# Patient Record
Sex: Female | Born: 2000 | Race: Black or African American | Hispanic: No | Marital: Single | Smoking: Former smoker
Health system: Southern US, Community
[De-identification: ages and names within clinical notes are randomized; demographics above are authoritative.]

## PROBLEM LIST (undated history)

## (undated) ENCOUNTER — Emergency Department (HOSPITAL_COMMUNITY): Admission: EM | Payer: No Typology Code available for payment source | Source: Home / Self Care

## (undated) DIAGNOSIS — G932 Benign intracranial hypertension: Secondary | ICD-10-CM

## (undated) DIAGNOSIS — R519 Headache, unspecified: Secondary | ICD-10-CM

## (undated) DIAGNOSIS — G919 Hydrocephalus, unspecified: Secondary | ICD-10-CM

## (undated) DIAGNOSIS — F419 Anxiety disorder, unspecified: Secondary | ICD-10-CM

## (undated) DIAGNOSIS — D497 Neoplasm of unspecified behavior of endocrine glands and other parts of nervous system: Secondary | ICD-10-CM

## (undated) DIAGNOSIS — R42 Dizziness and giddiness: Secondary | ICD-10-CM

## (undated) DIAGNOSIS — N809 Endometriosis, unspecified: Secondary | ICD-10-CM

## (undated) HISTORY — DX: Dizziness and giddiness: R42

## (undated) HISTORY — DX: Headache, unspecified: R51.9

---
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
## 2019-01-31 ENCOUNTER — Encounter (HOSPITAL_COMMUNITY): Payer: Self-pay | Admitting: Emergency Medicine

## 2019-01-31 ENCOUNTER — Emergency Department (HOSPITAL_COMMUNITY)

## 2019-01-31 ENCOUNTER — Inpatient Hospital Stay (HOSPITAL_COMMUNITY)
Admission: EM | Admit: 2019-01-31 | Discharge: 2019-02-01 | DRG: 641 | Discharge status: Against Medical Advice | Attending: Internal Medicine | Admitting: Internal Medicine

## 2019-01-31 ENCOUNTER — Other Ambulatory Visit: Payer: Self-pay

## 2019-01-31 DIAGNOSIS — A419 Sepsis, unspecified organism: Secondary | ICD-10-CM

## 2019-01-31 DIAGNOSIS — D696 Thrombocytopenia, unspecified: Secondary | ICD-10-CM | POA: Diagnosis present

## 2019-01-31 DIAGNOSIS — Z20828 Contact with and (suspected) exposure to other viral communicable diseases: Secondary | ICD-10-CM | POA: Diagnosis present

## 2019-01-31 DIAGNOSIS — T7840XA Allergy, unspecified, initial encounter: Secondary | ICD-10-CM | POA: Diagnosis present

## 2019-01-31 DIAGNOSIS — R6510 Systemic inflammatory response syndrome (SIRS) of non-infectious origin without acute organ dysfunction: Secondary | ICD-10-CM | POA: Diagnosis not present

## 2019-01-31 DIAGNOSIS — T368X5A Adverse effect of other systemic antibiotics, initial encounter: Secondary | ICD-10-CM | POA: Diagnosis present

## 2019-01-31 DIAGNOSIS — T360X5A Adverse effect of penicillins, initial encounter: Secondary | ICD-10-CM | POA: Diagnosis not present

## 2019-01-31 DIAGNOSIS — E860 Dehydration: Principal | ICD-10-CM | POA: Diagnosis present

## 2019-01-31 DIAGNOSIS — L509 Urticaria, unspecified: Secondary | ICD-10-CM | POA: Diagnosis present

## 2019-01-31 DIAGNOSIS — Z982 Presence of cerebrospinal fluid drainage device: Secondary | ICD-10-CM

## 2019-01-31 DIAGNOSIS — L02212 Cutaneous abscess of back [any part, except buttock]: Secondary | ICD-10-CM | POA: Diagnosis present

## 2019-01-31 DIAGNOSIS — E872 Acidosis: Secondary | ICD-10-CM | POA: Diagnosis present

## 2019-01-31 LAB — COMPREHENSIVE METABOLIC PANEL
ALT: 9 U/L (ref 0–44)
AST: 15 U/L (ref 15–41)
Albumin: 3.8 g/dL (ref 3.5–5.0)
Alkaline Phosphatase: 43 U/L (ref 38–126)
Anion gap: 16 — ABNORMAL HIGH (ref 5–15)
BUN: 11 mg/dL (ref 6–20)
CO2: 16 mmol/L — ABNORMAL LOW (ref 22–32)
Calcium: 9.1 mg/dL (ref 8.9–10.3)
Chloride: 102 mmol/L (ref 98–111)
Creatinine, Ser: 1.09 mg/dL — ABNORMAL HIGH (ref 0.44–1.00)
GFR calc Af Amer: >60 mL/min (ref >60)
GFR calc non Af Amer: >60 mL/min (ref >60)
Glucose, Bld: 130 mg/dL — ABNORMAL HIGH (ref 70–99)
Potassium: 3.6 mmol/L (ref 3.5–5.1)
Sodium: 134 mmol/L — ABNORMAL LOW (ref 135–145)
Total Bilirubin: 0.9 mg/dL (ref 0.3–1.2)
Total Protein: 7.1 g/dL (ref 6.5–8.1)

## 2019-01-31 LAB — ACETAMINOPHEN LEVEL: Acetaminophen (Tylenol), Serum: <10 ug/mL — ABNORMAL LOW (ref 10–30)

## 2019-01-31 LAB — CBC WITH DIFFERENTIAL/PLATELET
Abs Immature Granulocytes: 0.24 10*3/uL — ABNORMAL HIGH (ref 0.00–0.07)
Basophils Absolute: 0.1 10*3/uL (ref 0.0–0.1)
Basophils Relative: 1 %
Eosinophils Absolute: 0.6 10*3/uL — ABNORMAL HIGH (ref 0.0–0.5)
Eosinophils Relative: 5 %
HCT: 51.3 % — ABNORMAL HIGH (ref 36.0–46.0)
Hemoglobin: 17 g/dL — ABNORMAL HIGH (ref 12.0–15.0)
Immature Granulocytes: 2 %
Lymphocytes Relative: 33 %
Lymphs Abs: 4.2 10*3/uL — ABNORMAL HIGH (ref 0.7–4.0)
MCH: 28.6 pg (ref 26.0–34.0)
MCHC: 33.1 g/dL (ref 30.0–36.0)
MCV: 86.4 fL (ref 80.0–100.0)
Monocytes Absolute: 1 10*3/uL (ref 0.1–1.0)
Monocytes Relative: 8 %
Neutro Abs: 6.4 10*3/uL (ref 1.7–7.7)
Neutrophils Relative %: 51 %
Platelets: 844 10*3/uL — ABNORMAL HIGH (ref 150–400)
RBC: 5.94 MIL/uL — ABNORMAL HIGH (ref 3.87–5.11)
RDW: 13.4 % (ref 11.5–15.5)
WBC: 12.5 10*3/uL — ABNORMAL HIGH (ref 4.0–10.5)
nRBC: 0 % (ref 0.0–0.2)

## 2019-01-31 LAB — I-STAT CHEM 8, ED
BUN: 10 mg/dL (ref 6–20)
Calcium, Ion: 1.13 mmol/L — ABNORMAL LOW (ref 1.15–1.40)
Chloride: 101 mmol/L (ref 98–111)
Creatinine, Ser: 0.9 mg/dL (ref 0.44–1.00)
Glucose, Bld: 125 mg/dL — ABNORMAL HIGH (ref 70–99)
HCT: 52 % — ABNORMAL HIGH (ref 36.0–46.0)
Hemoglobin: 17.7 g/dL — ABNORMAL HIGH (ref 12.0–15.0)
Potassium: 3.5 mmol/L (ref 3.5–5.1)
Sodium: 134 mmol/L — ABNORMAL LOW (ref 135–145)
TCO2: 16 mmol/L — ABNORMAL LOW (ref 22–32)

## 2019-01-31 LAB — SALICYLATE LEVEL: Salicylate Lvl: <7 mg/dL (ref 2.8–30.0)

## 2019-01-31 LAB — I-STAT BETA HCG BLOOD, ED (MC, WL, AP ONLY): I-stat hCG, quantitative: <5 m[IU]/mL (ref <5)

## 2019-01-31 LAB — LACTIC ACID, PLASMA: Lactic Acid, Venous: 3.7 mmol/L (ref 0.5–1.9)

## 2019-01-31 LAB — ETHANOL: Alcohol, Ethyl (B): <10 mg/dL (ref <10)

## 2019-01-31 LAB — CK: Total CK: 13 U/L — ABNORMAL LOW (ref 38–234)

## 2019-01-31 LAB — SEDIMENTATION RATE: Sed Rate: 5 mm/hr (ref 0–22)

## 2019-01-31 LAB — TROPONIN I (HIGH SENSITIVITY): Troponin I (High Sensitivity): 2 ng/L (ref <18)

## 2019-01-31 MED ORDER — LORAZEPAM 2 MG/ML IJ SOLN
2.0000 mg | Freq: Once | INTRAMUSCULAR | Status: AC
  Administered 2019-01-31: 22:00:00 2 mg via INTRAVENOUS
  Filled 2019-01-31: qty 1

## 2019-01-31 MED ORDER — METRONIDAZOLE IN NACL 5-0.79 MG/ML-% IV SOLN
500.0000 mg | Freq: Once | INTRAVENOUS | Status: AC
  Administered 2019-02-01: 01:00:00 500 mg via INTRAVENOUS
  Filled 2019-01-31: qty 100

## 2019-01-31 MED ORDER — SODIUM CHLORIDE 0.9 % IV BOLUS
1000.0000 mL | Freq: Once | INTRAVENOUS | Status: AC
  Administered 2019-01-31: 22:00:00 1000 mL via INTRAVENOUS

## 2019-01-31 MED ORDER — EPINEPHRINE 1 MG/10ML IJ SOSY
0.3000 mg | PREFILLED_SYRINGE | Freq: Once | INTRAMUSCULAR | Status: AC
  Administered 2019-01-31: 22:00:00 0.3 mg via INTRAMUSCULAR

## 2019-01-31 MED ORDER — FAMOTIDINE IN NACL 20-0.9 MG/50ML-% IV SOLN
20.0000 mg | Freq: Once | INTRAVENOUS | Status: AC
  Administered 2019-01-31: 22:00:00 20 mg via INTRAVENOUS
  Filled 2019-01-31: qty 50

## 2019-01-31 MED ORDER — VANCOMYCIN HCL IN DEXTROSE 1-5 GM/200ML-% IV SOLN
1000.0000 mg | Freq: Once | INTRAVENOUS | Status: AC
  Administered 2019-02-01: 1000 mg via INTRAVENOUS
  Filled 2019-01-31: qty 200

## 2019-01-31 MED ORDER — SODIUM CHLORIDE 0.9 % IV SOLN
2.0000 g | Freq: Once | INTRAVENOUS | Status: AC
  Administered 2019-01-31: 2 g via INTRAVENOUS
  Filled 2019-01-31: qty 2

## 2019-01-31 MED ORDER — SODIUM CHLORIDE 0.9 % IV BOLUS
1000.0000 mL | Freq: Once | INTRAVENOUS | Status: AC
  Administered 2019-01-31: 1000 mL via INTRAVENOUS

## 2019-01-31 MED ORDER — METHYLPREDNISOLONE SODIUM SUCC 125 MG IJ SOLR
125.0000 mg | Freq: Once | INTRAMUSCULAR | Status: AC
  Administered 2019-01-31: 125 mg via INTRAVENOUS
  Filled 2019-01-31: qty 2

## 2019-01-31 MED ORDER — DIPHENHYDRAMINE HCL 50 MG/ML IJ SOLN
50.0000 mg | Freq: Once | INTRAMUSCULAR | Status: AC
  Administered 2019-01-31: 22:00:00 50 mg via INTRAVENOUS
  Filled 2019-01-31: qty 1

## 2019-01-31 NOTE — ED Notes (Signed)
Pt asked to provide a urine specimen, pt stated "I need some water before I can pee". Pt made aware she was receiving IV fluids and needed to wait for her CT scan to be completed before having PO fluids. Pt agreeable.

## 2019-01-31 NOTE — Progress Notes (Signed)
A consult was received from an ED physician for Vancomycin, cefepimeper pharmacy dosing.  The patient's profile has been reviewed for ht/wt/allergies/indication/available labs.   A one time order has been placed for Vancomycin 1gm iv x1, and Cefepime 2gm iv x1.  Further antibiotics/pharmacy consults should be ordered by admitting physician if indicated.                       Thank you, Nani Skillern Crowford 01/31/2019  11:35 PM

## 2019-01-31 NOTE — ED Provider Notes (Signed)
[facility] DEPT Provider Note   CSN: [id number] Arrival date & time: 01/31/19  2142     History   Chief Complaint No chief complaint on file.   HPI Mckenley Gauvin is a 18 y.o. female here with possible allergic reaction.  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  She states that she just got out of the hospital in [place] about a week ago for sepsis after the VP shunt removal.  She states that she is taking amoxicillin and Bactrim currently.  Just prior to arrival, she had episode where she almost passed out and she was noted to have diffuse rash in triage and appears ill.  Patient denies any new medicines since a week ago or new foods or shampoos.  Patient was given IM epi in triage for my exam.  Patient denies any drug or alcohol use.  Denies any fever since discharge from the hospital a week ago.     The history is provided by the patient.    No past medical history on file.  There are no active problems to display for this patient.   Past Surgical History:  Procedure Laterality Date  . VENTRICULOPERITONEAL SHUNT  2020     OB History   No obstetric history on file.      Home Medications    Prior to Admission medications   Medication Sig Start Date End Date Taking? Authorizing Provider  amoxicillin-clavulanate (AUGMENTIN) 875-125 MG tablet Take 1 tablet by mouth 2 (two) times daily.  01/25/19  Yes [provider]  sulfamethoxazole-trimethoprim (BACTRIM DS) 800-160 MG tablet Take 1 tablet by mouth 2 (two) times daily. for 10 days 01/25/19  Yes [provider]  metroNIDAZOLE (FLAGYL) 500 MG tablet Take 1 tablet (500 mg total) by mouth 2 (two) times daily. Patient not taking: Reported on 01/31/2019 09/16/18   Orvan July, NP    Family History Family History  Problem Relation Age of Onset  . Healthy Mother   . Healthy Father     Social History  Social History   Tobacco Use  . Smoking status: Never Smoker  . Smokeless tobacco: Never Used  Substance Use Topics  . Alcohol use: Never    Frequency: Never  . Drug use: Not on file     Allergies   Patient has no known allergies.   Review of Systems Review of Systems  Skin: Positive for rash.  All other systems reviewed and are negative.    Physical Exam Updated Vital Signs BP (!) 111/99 (BP Location: Left Arm)   Pulse (!) 115   Temp 98.2 F (36.8 C) (Rectal)   Resp 20   Ht 5\' 7"  (1.702 m)   Wt 59 kg   SpO2 100%   BMI 20.36 kg/m   Physical Exam Vitals signs and nursing note reviewed.  Constitutional:      Comments: Anxious, tachycardic   HENT:     Head: Normocephalic.     Nose: Nose normal.     Mouth/Throat:     Mouth: Mucous membranes are dry.  Eyes:     Extraocular Movements: Extraocular movements intact.     Pupils: Pupils are equal, round, and reactive to light.     Comments: Pupils 6 cm but reactive bilaterally   Neck:     Musculoskeletal: Neck supple.  Cardiovascular:     Rate and Rhythm: Regular rhythm. Tachycardia  present.     Pulses: Normal pulses.     Heart sounds: Normal heart sounds.  Pulmonary:     Effort: Pulmonary effort is normal.     Breath sounds: Normal breath sounds.  Abdominal:     General: Abdomen is flat.     Palpations: Abdomen is soft.  Musculoskeletal:     Comments: Previous lower lumbar laminectomy scar that is healing well, no signs of fluctuance and no signs of abscess   Skin:    Capillary Refill: Capillary refill takes less than 2 seconds.     Comments: Diffuse urticaria. No skin peeling. No vesicles.   Neurological:     General: No focal deficit present.     Mental Status: She is alert.     Comments: No saddle anesthesia, nl strength and sensation bilateral lower extremities   Psychiatric:     Comments: Anxious       ED Treatments / Results  Labs (all labs ordered are listed, but only abnormal results are  displayed) Labs Reviewed  CBC WITH DIFFERENTIAL/PLATELET - Abnormal; Notable for the following components:      Result Value   WBC 12.5 (*)    RBC 5.94 (*)    Hemoglobin 17.0 (*)    HCT 51.3 (*)    Platelets 844 (*)    All other components within normal limits  ACETAMINOPHEN LEVEL - Abnormal; Notable for the following components:   Acetaminophen (Tylenol), Serum <10 (*)    All other components within normal limits  LACTIC ACID, PLASMA - Abnormal; Notable for the following components:   Lactic Acid, Venous 3.7 (*)    All other components within normal limits  I-STAT CHEM 8, ED - Abnormal; Notable for the following components:   Sodium 134 (*)    Glucose, Bld 125 (*)    Calcium, Ion 1.13 (*)    TCO2 16 (*)    Hemoglobin 17.7 (*)    HCT 52.0 (*)    All other components within normal limits  CULTURE, BLOOD (ROUTINE X 2)  CULTURE, BLOOD (ROUTINE X 2)  URINE CULTURE  ETHANOL  SALICYLATE LEVEL  COMPREHENSIVE METABOLIC PANEL  SEDIMENTATION RATE  CK  LACTIC ACID, PLASMA  URINALYSIS, ROUTINE W REFLEX MICROSCOPIC  RAPID URINE DRUG SCREEN, HOSP PERFORMED  I-STAT BETA HCG BLOOD, ED ([facility], [facility], [facility] ONLY)  TROPONIN I (HIGH SENSITIVITY)    EKG EKG Interpretation  Date/Time:  Saturday January 31 2019 21:47:57 EDT Ventricular Rate:  164 PR Interval:    QRS Duration: 116 QT Interval:  280 QTC Calculation: 463 R Axis:   87 Text Interpretation:  Sinus tachycardia Ventricular premature complex Prolonged PR interval LAE, consider biatrial enlargement Nonspecific intraventricular conduction delay Nonspecific T abnormalities, lateral leads Artifact in lead(s) I II III aVR aVL aVF V3 V4 V5 V6 and baseline wander in lead(s) V2 V3 V4 poor baseline, no previous to compared to. Patient just received epi  Confirmed by Wandra Arthurs [id number]) on 01/31/2019 10:13:41 PM   Radiology Dg Chest Port 1 View  Result Date: 01/31/2019 CLINICAL DATA:  Syncope EXAM: PORTABLE CHEST 1 VIEW COMPARISON:  None.  FINDINGS: The heart size and mediastinal contours are within normal limits. Both lungs are clear. The visualized skeletal structures are unremarkable. IMPRESSION: No active disease. Electronically Signed   By: Donavan Foil M.D.   On: 01/31/2019 22:29    Procedures Procedures (including critical care time)  CRITICAL CARE Performed by: Wandra Arthurs   Total critical care time:30  minutes  Critical care time was exclusive of separately billable procedures and treating other patients.  Critical care was necessary to treat or prevent imminent or life-threatening deterioration.  Critical care was time spent personally by me on the following activities: development of treatment plan with patient and/or surrogate as well as nursing, discussions with consultants, evaluation of patient's response to treatment, examination of patient, obtaining history from patient or surrogate, ordering and performing treatments and interventions, ordering and review of laboratory studies, ordering and review of radiographic studies, pulse oximetry and re-evaluation of patient's condition.   Medications Ordered in ED Medications  methylPREDNISolone sodium succinate (SOLU-MEDROL) 125 mg/2 mL injection 125 mg (125 mg Intravenous Given 01/31/19 2219)  famotidine (PEPCID) IVPB 20 mg premix (20 mg Intravenous New Bag/Given 01/31/19 2221)  EPINEPHrine (ADRENALIN) 1 MG/10ML injection 0.3 mg (0.3 mg Intramuscular Given 01/31/19 2220)  diphenhydrAMINE (BENADRYL) injection 50 mg (50 mg Intravenous Given 01/31/19 2219)  sodium chloride 0.9 % bolus 1,000 mL (1,000 mLs Intravenous New Bag/Given 01/31/19 2221)  LORazepam (ATIVAN) injection 2 mg (2 mg Intravenous Given 01/31/19 2219)     Initial Impression / Assessment and Plan / ED Course  I have reviewed the triage vital signs and the nursing notes.  Pertinent labs & imaging results that were available during my care of the patient were reviewed by me and considered in my medical  decision making (see chart for details).        Domenica Hermoso is a 18 y.o. female here with rash, possible allergic reaction.  She is currently on amoxicillin and Bactrim for the last week or so .  Wonder if she had an allergic reaction to either of those antibiotics.  She denies any new foods or shampoos.  She came in tachycardic and was ill-appearing so epinephrine was given for possible anaphylaxis. She is protecting her airway currently.  She is also recently admitted for sepsis but cannot tell me what source of infection was.  She told me that she had a VP shunt that was removed and subsequently apparently had an infection and that is why she was admitted.  We will give Solu-Medrol and Pepcid and Benadryl.  We will do broad workup for sepsis and get CT head given recent VP shunt removal. Will do toxic metabolic workup as well.   11:08 PM Lactate elevated 3.7. WBC 12. COVID pending. CT head/lumbar spine ordered. Signed out to Dr. Randal Buba. Anticipate admission for anaphylaxis, sepsis.    Final Clinical Impressions(s) / ED Diagnoses   Final diagnoses:  None    ED Discharge Orders    None       Drenda Freeze, MD 01/31/19 2310

## 2019-01-31 NOTE — ED Notes (Signed)
Patient states that she has a pituitary tumor and has been monitored by a neurologist who wants to watch it. Patient states that she "passed out" in the lobby because her vision was black and she was experiencing SOB. Patient currently endorses intermittent blurry vision and not being able to see anything, but denies SOB at this time. She came to be seen today because she woke up with a rash. Raised rash noted to entire body. Patient appears very anxious at this time. Will continue to monitor patient.

## 2019-02-01 ENCOUNTER — Encounter (HOSPITAL_COMMUNITY): Payer: Self-pay | Admitting: Emergency Medicine

## 2019-02-01 ENCOUNTER — Emergency Department (HOSPITAL_COMMUNITY)

## 2019-02-01 DIAGNOSIS — Z20828 Contact with and (suspected) exposure to other viral communicable diseases: Secondary | ICD-10-CM | POA: Diagnosis present

## 2019-02-01 DIAGNOSIS — T7840XA Allergy, unspecified, initial encounter: Secondary | ICD-10-CM | POA: Diagnosis present

## 2019-02-01 DIAGNOSIS — R6510 Systemic inflammatory response syndrome (SIRS) of non-infectious origin without acute organ dysfunction: Secondary | ICD-10-CM | POA: Diagnosis present

## 2019-02-01 DIAGNOSIS — E860 Dehydration: Secondary | ICD-10-CM | POA: Diagnosis present

## 2019-02-01 DIAGNOSIS — T360X5A Adverse effect of penicillins, initial encounter: Secondary | ICD-10-CM | POA: Diagnosis present

## 2019-02-01 DIAGNOSIS — T368X5A Adverse effect of other systemic antibiotics, initial encounter: Secondary | ICD-10-CM | POA: Diagnosis present

## 2019-02-01 DIAGNOSIS — E872 Acidosis: Secondary | ICD-10-CM

## 2019-02-01 DIAGNOSIS — L02212 Cutaneous abscess of back [any part, except buttock]: Secondary | ICD-10-CM | POA: Diagnosis present

## 2019-02-01 DIAGNOSIS — Z982 Presence of cerebrospinal fluid drainage device: Secondary | ICD-10-CM

## 2019-02-01 DIAGNOSIS — D696 Thrombocytopenia, unspecified: Secondary | ICD-10-CM | POA: Diagnosis present

## 2019-02-01 DIAGNOSIS — L509 Urticaria, unspecified: Secondary | ICD-10-CM | POA: Diagnosis present

## 2019-02-01 LAB — PHOSPHORUS: Phosphorus: 3.4 mg/dL (ref 2.5–4.6)

## 2019-02-01 LAB — CBC
HCT: 36.4 % (ref 36.0–46.0)
Hemoglobin: 11.9 g/dL — ABNORMAL LOW (ref 12.0–15.0)
MCH: 28.9 pg (ref 26.0–34.0)
MCHC: 32.7 g/dL (ref 30.0–36.0)
MCV: 88.3 fL (ref 80.0–100.0)
Platelets: 416 10*3/uL — ABNORMAL HIGH (ref 150–400)
RBC: 4.12 MIL/uL (ref 3.87–5.11)
RDW: 13.4 % (ref 11.5–15.5)
WBC: 7 10*3/uL (ref 4.0–10.5)
nRBC: 0 % (ref 0.0–0.2)

## 2019-02-01 LAB — URINALYSIS, ROUTINE W REFLEX MICROSCOPIC
Bacteria, UA: NONE SEEN
Bilirubin Urine: NEGATIVE
Glucose, UA: NEGATIVE mg/dL
Hgb urine dipstick: NEGATIVE
Ketones, ur: 20 mg/dL — AB
Leukocytes,Ua: NEGATIVE
Nitrite: NEGATIVE
Protein, ur: 30 mg/dL — AB
Specific Gravity, Urine: 1.029 (ref 1.005–1.030)
pH: 5 (ref 5.0–8.0)

## 2019-02-01 LAB — RAPID URINE DRUG SCREEN, HOSP PERFORMED
Amphetamines: NOT DETECTED
Barbiturates: NOT DETECTED
Benzodiazepines: NOT DETECTED
Cocaine: NOT DETECTED
Opiates: NOT DETECTED
Tetrahydrocannabinol: NOT DETECTED

## 2019-02-01 LAB — COMPREHENSIVE METABOLIC PANEL
ALT: 8 U/L (ref 0–44)
AST: 10 U/L — ABNORMAL LOW (ref 15–41)
Albumin: 2.9 g/dL — ABNORMAL LOW (ref 3.5–5.0)
Alkaline Phosphatase: 32 U/L — ABNORMAL LOW (ref 38–126)
Anion gap: 8 (ref 5–15)
BUN: 10 mg/dL (ref 6–20)
CO2: 17 mmol/L — ABNORMAL LOW (ref 22–32)
Calcium: 7.8 mg/dL — ABNORMAL LOW (ref 8.9–10.3)
Chloride: 112 mmol/L — ABNORMAL HIGH (ref 98–111)
Creatinine, Ser: 0.58 mg/dL (ref 0.44–1.00)
GFR calc Af Amer: >60 mL/min (ref >60)
GFR calc non Af Amer: >60 mL/min (ref >60)
Glucose, Bld: 138 mg/dL — ABNORMAL HIGH (ref 70–99)
Potassium: 4.4 mmol/L (ref 3.5–5.1)
Sodium: 137 mmol/L (ref 135–145)
Total Bilirubin: 0.6 mg/dL (ref 0.3–1.2)
Total Protein: 5.5 g/dL — ABNORMAL LOW (ref 6.5–8.1)

## 2019-02-01 LAB — MAGNESIUM: Magnesium: 2 mg/dL (ref 1.7–2.4)

## 2019-02-01 LAB — SARS CORONAVIRUS 2 BY RT PCR (HOSPITAL ORDER, PERFORMED IN ~~LOC~~ HOSPITAL LAB): SARS Coronavirus 2: NEGATIVE

## 2019-02-01 LAB — LACTIC ACID, PLASMA: Lactic Acid, Venous: 1.7 mmol/L (ref 0.5–1.9)

## 2019-02-01 MED ORDER — SODIUM BICARBONATE-DEXTROSE 150-5 MEQ/L-% IV SOLN
150.0000 meq | INTRAVENOUS | Status: DC
  Administered 2019-02-01: 150 meq via INTRAVENOUS
  Filled 2019-02-01 (×2): qty 1000

## 2019-02-01 MED ORDER — ONDANSETRON HCL 4 MG PO TABS: 4.0000 mg | ORAL_TABLET | Freq: Four times a day (QID) | ORAL | Status: DC | PRN

## 2019-02-01 MED ORDER — DEXTROSE-NACL 5-0.9 % IV SOLN
INTRAVENOUS | Status: DC
  Administered 2019-02-01: 06:00:00 via INTRAVENOUS

## 2019-02-01 MED ORDER — SODIUM CHLORIDE 0.9 % IV BOLUS
30.0000 mL/kg | Freq: Once | INTRAVENOUS | Status: AC
  Administered 2019-02-01: 1770 mL via INTRAVENOUS

## 2019-02-01 MED ORDER — LIP MEDEX EX OINT
TOPICAL_OINTMENT | CUTANEOUS | Status: AC
  Administered 2019-02-01: 16:00:00
  Filled 2019-02-01: qty 7

## 2019-02-01 MED ORDER — SODIUM CHLORIDE 0.9 % IV SOLN
2.0000 g | Freq: Three times a day (TID) | INTRAVENOUS | Status: DC
  Administered 2019-02-01 (×2): 2 g via INTRAVENOUS
  Filled 2019-02-01 (×4): qty 2

## 2019-02-01 MED ORDER — ONDANSETRON HCL 4 MG/2ML IJ SOLN: 4.0000 mg | Freq: Four times a day (QID) | INTRAMUSCULAR | Status: DC | PRN

## 2019-02-01 NOTE — ED Notes (Signed)
ED TO INPATIENT HANDOFF REPORT  Name/Age/Gender Paula Haley 18 y.o. female  Code Status   Home/SNF/Other Home  Chief Complaint Allergic Reaction   Level of Care/Admitting Diagnosis ED Disposition    ED Disposition Condition [facility] Area: [place] [[id number]]  Level of Care: Med-Surg [16]  Covid Evaluation: Asymptomatic Screening Protocol (No Symptoms)  Diagnosis: SIRS (systemic inflammatory response syndrome) ([place]) [id number]  Admitting Physician: Elwyn Reach [[id number]]  Attending Physician: Elwyn Reach [[id number]]  PT Class (Do Not Modify): Observation [104]  PT Acc Code (Do Not Modify): Observation [10022]       Medical History History reviewed. No pertinent past medical history.  Allergies No Known Allergies  IV Location/Drains/Wounds Patient Lines/Drains/Airways Status   Active Line/Drains/Airways    Name:   Placement date:   Placement time:   Site:   Days:   Peripheral IV 01/31/19 Right Wrist   01/31/19    2200    Wrist   1          Labs/Imaging Results for orders placed or performed during the hospital encounter of 01/31/19 (from the past 48 hour(s))  CBC with Differential/Platelet     Status: Abnormal   Collection Time: 01/31/19  9:55 PM  Result Value Ref Range   WBC 12.5 (H) 4.0 - 10.5 K/uL   RBC 5.94 (H) 3.87 - 5.11 MIL/uL   Hemoglobin 17.0 (H) 12.0 - 15.0 g/dL   HCT 51.3 (H) 36.0 - 46.0 %   MCV 86.4 80.0 - 100.0 fL   MCH 28.6 26.0 - 34.0 pg   MCHC 33.1 30.0 - 36.0 g/dL   RDW 13.4 11.5 - 15.5 %   Platelets 844 (H) 150 - 400 K/uL   nRBC 0.0 0.0 - 0.2 %   Neutrophils Relative % 51 %   Neutro Abs 6.4 1.7 - 7.7 K/uL   Lymphocytes Relative 33 %   Lymphs Abs 4.2 (H) 0.7 - 4.0 K/uL   Monocytes Relative 8 %   Monocytes Absolute 1.0 0.1 - 1.0 K/uL   Eosinophils Relative 5 %   Eosinophils Absolute 0.6 (H) 0.0 - 0.5 K/uL   Basophils Relative 1 %   Basophils Absolute 0.1 0.0 - 0.1 K/uL   WBC Morphology  TOXIC GRANULATION    Immature Granulocytes 2 %   Abs Immature Granulocytes 0.24 (H) 0.00 - 0.07 K/uL   Reactive, Benign Lymphocytes PRESENT    Burr Cells PRESENT     Comment: Performed at [facility], [place] [address], [place], [place] [address]  Comprehensive metabolic panel     Status: Abnormal   Collection Time: 01/31/19  9:55 PM  Result Value Ref Range   Sodium 134 (L) 135 - 145 mmol/L   Potassium 3.6 3.5 - 5.1 mmol/L   Chloride 102 98 - 111 mmol/L   CO2 16 (L) 22 - 32 mmol/L   Glucose, Bld 130 (H) 70 - 99 mg/dL   BUN 11 6 - 20 mg/dL   Creatinine, Ser 1.09 (H) 0.44 - 1.00 mg/dL   Calcium 9.1 8.9 - 10.3 mg/dL   Total Protein 7.1 6.5 - 8.1 g/dL   Albumin 3.8 3.5 - 5.0 g/dL   AST 15 15 - 41 U/L   ALT 9 0 - 44 U/L   Alkaline Phosphatase 43 38 - 126 U/L   Total Bilirubin 0.9 0.3 - 1.2 mg/dL   GFR calc non Af Amer >60 >60 mL/min   GFR calc Af  Amer >60 >60 mL/min   Anion gap 16 (H) 5 - 15    Comment: Performed at [facility], [place] [address], [place], [place] [address]  Ethanol     Status: None   Collection Time: 01/31/19  9:55 PM  Result Value Ref Range   Alcohol, Ethyl (B) <10 <10 mg/dL    Comment: (NOTE) Lowest detectable limit for serum alcohol is 10 mg/dL. For medical purposes only. Performed at [facility], [place] [address] [place], [place] [id number]   Salicylate level     Status: None   Collection Time: 01/31/19  9:55 PM  Result Value Ref Range   Salicylate Lvl [id number] 2.8 - 30.0 mg/dL    Comment: Performed at [facility], [place] [address], [place], [place] [address]  Acetaminophen level     Status: Abnormal   Collection Time: 01/31/19  9:55 PM  Result Value Ref Range   Acetaminophen (Tylenol), Serum <10 (L) 10 - 30 ug/mL    Comment: (NOTE) Therapeutic concentrations vary significantly. A range of 10-30 ug/mL  may be an effective concentration for many patients. However, some  are best treated  at concentrations outside of this range. Acetaminophen concentrations >150 ug/mL at 4 hours after ingestion  and >50 ug/mL at 12 hours after ingestion are often associated with  toxic reactions. Performed at [facility], [place] [address] [place], [place] [address]   Troponin I (High Sensitivity)     Status: None   Collection Time: 01/31/19  9:55 PM  Result Value Ref Range   Troponin I (High Sensitivity) 2 <18 ng/L    Comment: (NOTE) Elevated high sensitivity troponin I (hsTnI) values and significant  changes across serial measurements may suggest ACS but many other  chronic and acute conditions are known to elevate hsTnI results.  Refer to the "Links" section for chest pain algorithms and additional  guidance. Performed at [facility], [place] [address] [place], [place] [address]   Sedimentation rate     Status: None   Collection Time: 01/31/19  9:55 PM  Result Value Ref Range   Sed Rate 5 0 - 22 mm/hr    Comment: Performed at [facility], [place] [address], [place], [place] [address]  CK     Status: Abnormal   Collection Time: 01/31/19  9:55 PM  Result Value Ref Range   Total CK 13 (L) 38 - 234 U/L    Comment: Performed at [facility], [place] [address] [place], [place] [address]  Lactic acid, plasma     Status: Abnormal   Collection Time: 01/31/19  9:55 PM  Result Value Ref Range   Lactic Acid, Venous 3.7 (HH) 0.5 - 1.9 mmol/L    Comment: CRITICAL RESULT CALLED TO, READ BACK BY AND VERIFIED WITH: B,BROOKS AT 2246 ON 01/31/19 BY A,MOHAMED Performed at [facility], [place] [address] [place], [place] [address]   I-Stat Beta hCG blood, ED ([facility], [facility], [facility] only)     Status: None   Collection Time: 01/31/19 10:02 PM  Result Value Ref Range   I-stat hCG, quantitative <5.0 <5 mIU/mL   Comment 3            Comment:   GEST. AGE      CONC.  (mIU/mL)   <=1 WEEK        5 - 50     2 WEEKS       50 -  500     3 WEEKS       100 - 10,000     4 WEEKS     1,000 - 30,000        FEMALE AND NON-PREGNANT FEMALE:     LESS THAN 5 mIU/mL   I-stat chem 8, ED (not at [facility] or [facility])     Status: Abnormal   Collection Time: 01/31/19 10:18 PM  Result Value Ref Range   Sodium 134 (L) 135 - 145 mmol/L   Potassium 3.5 3.5 - 5.1 mmol/L   Chloride 101 98 - 111 mmol/L   BUN 10 6 - 20 mg/dL   Creatinine, Ser 0.90 0.44 - 1.00 mg/dL   Glucose, Bld 125 (H) 70 - 99 mg/dL   Calcium, Ion 1.13 (L) 1.15 - 1.40 mmol/L   TCO2 16 (L) 22 - 32 mmol/L   Hemoglobin 17.7 (H) 12.0 - 15.0 g/dL   HCT 52.0 (H) 36.0 - 46.0 %  Urinalysis, Routine w reflex microscopic     Status: Abnormal   Collection Time: 01/31/19 11:38 PM  Result Value Ref Range   Color, Urine AMBER (A) YELLOW    Comment: BIOCHEMICALS MAY BE AFFECTED BY COLOR   APPearance HAZY (A) CLEAR   Specific Gravity, Urine 1.029 1.005 - 1.030   pH 5.0 5.0 - 8.0   Glucose, UA NEGATIVE NEGATIVE mg/dL   Hgb urine dipstick NEGATIVE NEGATIVE   Bilirubin Urine NEGATIVE NEGATIVE   Ketones, ur 20 (A) NEGATIVE mg/dL   Protein, ur 30 (A) NEGATIVE mg/dL   Nitrite NEGATIVE NEGATIVE   Leukocytes,Ua NEGATIVE NEGATIVE   RBC / HPF 0-5 0 - 5 RBC/hpf   WBC, UA 11-20 0 - 5 WBC/hpf   Bacteria, UA NONE SEEN NONE SEEN   Squamous Epithelial / LPF 0-5 0 - 5   Mucus PRESENT    Hyaline Casts, UA PRESENT     Comment: Performed at [facility], [place] [address] [place], [place] [address]  Rapid urine drug screen (hospital performed)     Status: None   Collection Time: 01/31/19 11:38 PM  Result Value Ref Range   Opiates NONE DETECTED NONE DETECTED   Cocaine NONE DETECTED NONE DETECTED   Benzodiazepines NONE DETECTED NONE DETECTED   Amphetamines NONE DETECTED NONE DETECTED   Tetrahydrocannabinol NONE DETECTED NONE DETECTED   Barbiturates NONE DETECTED NONE DETECTED    Comment: (NOTE) DRUG SCREEN FOR MEDICAL PURPOSES ONLY.  IF CONFIRMATION IS NEEDED FOR ANY  PURPOSE, NOTIFY LAB WITHIN 5 DAYS. LOWEST DETECTABLE LIMITS FOR URINE DRUG SCREEN Drug Class                     Cutoff (ng/mL) Amphetamine and metabolites    1000 Barbiturate and metabolites    200 Benzodiazepine                 [id number] Tricyclics and metabolites     300 Opiates and metabolites        300 Cocaine and metabolites        300 THC                            50 Performed at [facility], [place] [address] [place], [place] [address]   Lactic acid, plasma     Status: None   Collection Time: 02/01/19 12:00 AM  Result Value Ref Range   Lactic Acid, Venous 1.7 0.5 - 1.9  mmol/L    Comment: Performed at [facility], [place] [address] [place], [place] [address]  SARS Coronavirus 2 [facility] order, Performed in [facility] hospital lab) Nasopharyngeal Nasopharyngeal Swab     Status: None   Collection Time: 02/01/19 12:00 AM   Specimen: Nasopharyngeal Swab  Result Value Ref Range   SARS Coronavirus 2 NEGATIVE NEGATIVE    Comment: (NOTE) If result is NEGATIVE SARS-CoV-2 target nucleic acids are NOT DETECTED. The SARS-CoV-2 RNA is generally detectable in upper and lower  respiratory specimens during the acute phase of infection. The lowest  concentration of SARS-CoV-2 viral copies this assay can detect is 250  copies / mL. A negative result does not preclude SARS-CoV-2 infection  and should not be used as the sole basis for treatment or other  patient management decisions.  A negative result may occur with  improper specimen collection / handling, submission of specimen other  than nasopharyngeal swab, presence of viral mutation(s) within the  areas targeted by this assay, and inadequate number of viral copies  (<250 copies / mL). A negative result must be combined with clinical  observations, patient history, and epidemiological information. If result is POSITIVE SARS-CoV-2 target nucleic acids are DETECTED. The SARS-CoV-2 RNA is generally  detectable in upper and lower  respiratory specimens dur ing the acute phase of infection.  Positive  results are indicative of active infection with SARS-CoV-2.  Clinical  correlation with patient history and other diagnostic information is  necessary to determine patient infection status.  Positive results do  not rule out bacterial infection or co-infection with other viruses. If result is PRESUMPTIVE POSTIVE SARS-CoV-2 nucleic acids MAY BE PRESENT.   A presumptive positive result was obtained on the submitted specimen  and confirmed on repeat testing.  While 2019 novel coronavirus  (SARS-CoV-2) nucleic acids may be present in the submitted sample  additional confirmatory testing may be necessary for epidemiological  and / or clinical management purposes  to differentiate between  SARS-CoV-2 and other Sarbecovirus currently known to infect humans.  If clinically indicated additional testing with an alternate test  methodology [phone]) is advised. The SARS-CoV-2 RNA is generally  detectable in upper and lower respiratory sp ecimens during the acute  phase of infection. The expected result is Negative. Fact Sheet for Patients:  StrictlyIdeas.no Fact Sheet for Healthcare Providers: BankingDealers.co.za This test is not yet approved or cleared by the [place] FDA and has been authorized for detection and/or diagnosis of SARS-CoV-2 by FDA under an Emergency Use Authorization (EUA).  This EUA will remain in effect (meaning this test can be used) for the duration of the COVID-19 declaration under Section 564(b)(1) of the Act, 21 U.S.C. section 360bbb-3(b)(1), unless the authorization is terminated or revoked sooner. Performed at [facility], [place] [address], [place], [place] [address]    Ct Head Wo Contrast  Result Date: 02/01/2019 CLINICAL DATA:  Altered level consciousness. History of VP shunt post removal EXAM:  CT HEAD WITHOUT CONTRAST TECHNIQUE: Contiguous axial images were obtained from the base of the skull through the vertex without intravenous contrast. COMPARISON:  None. FINDINGS: Brain: No intracranial hemorrhage, mass effect, or midline shift. No hydrocephalus. No visualized white matter tract to suggest prior VP shunt. The basilar cisterns are patent. No evidence of territorial infarct or acute ischemia. No extra-axial or intracranial fluid collection. Vascular: No hyperdense vessel or unexpected calcification. Skull: No fracture or focal lesion. Sinuses/Orbits: Paranasal sinuses and mastoid air cells are clear.  The visualized orbits are unremarkable. Other: Scalp soft tissues are unremarkable. IMPRESSION: No acute intracranial abnormality. Electronically Signed   By: Keith Rake M.D.   On: 02/01/2019 01:22   Ct Lumbar Spine Wo Contrast  Result Date: 02/01/2019 CLINICAL DATA:  Initial evaluation for acute back pain, recent surgery. EXAM: CT LUMBAR SPINE WITHOUT CONTRAST TECHNIQUE: Multidetector CT imaging of the lumbar spine was performed without intravenous contrast administration. Multiplanar CT image reconstructions were also generated. COMPARISON:  None available. FINDINGS: Segmentation: Standard. Lowest well-formed disc labeled the L5-S1 level. Alignment: Physiologic with preservation of the normal lumbar lordosis. No listhesis. Vertebrae: Vertebral body height maintained without evidence for acute or chronic fracture. Visualized sacrum and pelvis intact. SI joints approximated symmetric. No discrete osseous lesions. Paraspinal and other soft tissues: Loculated somewhat thick-walled collection seen just to the right of midline within the posterior subcutaneous fat at the level of L2-3. This measures approximately 3.4 x 7.8 x 3.3 cm (series 4, image 55). Small focal area of subfascial extension noted towards the L2 spinous process (a series 4, image 58). Finding indeterminate, and could reflect a  benign postoperative seroma. Superimposed infection not excluded, and could be considered in the correct clinical setting. Paraspinous soft tissues otherwise unremarkable. Visualized intraabdominal contents within normal limits. Disc levels: No significant disc pathology seen within the lumbar spine. No canal or neural foraminal stenosis. No appreciable intraspinal or epidural collections on this noncontrast examination. IMPRESSION: 1. 3.4 x 7.8 x 3.3 cm loculated collection within the posterior subcutaneous fat at the level of L2-3 as above. Finding is indeterminate, and could reflect a benign postoperative seroma. Superimposed infection not excluded, and could be considered in the correct clinical setting. 2. No other acute abnormality within the lumbar spine. Electronically Signed   By: Jeannine Boga M.D.   On: 02/01/2019 01:00   Dg Chest Port 1 View  Result Date: 01/31/2019 CLINICAL DATA:  Syncope EXAM: PORTABLE CHEST 1 VIEW COMPARISON:  None. FINDINGS: The heart size and mediastinal contours are within normal limits. Both lungs are clear. The visualized skeletal structures are unremarkable. IMPRESSION: No active disease. Electronically Signed   By: Donavan Foil M.D.   On: 01/31/2019 22:29    Pending Labs Unresulted Labs (From admission, onward)    Start     Ordered   01/31/19 2149  Urine culture  ONCE - STAT,   STAT     01/31/19 2148   01/31/19 2148  Blood culture (routine x 2)  BLOOD CULTURE X 2,   STAT     01/31/19 2147   Signed and Held  HIV Antibody  (Routine Testing)  Once,   R     Signed and Held   Signed and Held  Comprehensive metabolic panel  Tomorrow morning,   R     Signed and Held   Signed and Held  CBC  Tomorrow morning,   R     Signed and Held          Vitals/Pain Today's Vitals   02/01/19 0330 02/01/19 0400 02/01/19 0430 02/01/19 0500  BP: 111/71 115/71 114/72 112/72  Pulse: 84 78 80 78  Resp: 20 17 19 20   Temp:      TempSrc:      SpO2: 99% 99% 99% 99%   Weight:      Height:      PainSc:        Isolation Precautions No active isolations  Medications Medications  ceFEPIme (MAXIPIME) 2 g in sodium chloride  0.9 % 100 mL IVPB (has no administration in time range)  methylPREDNISolone sodium succinate (SOLU-MEDROL) 125 mg/2 mL injection 125 mg (125 mg Intravenous Given 01/31/19 2219)  famotidine (PEPCID) IVPB 20 mg premix (0 mg Intravenous Stopped 01/31/19 2336)  EPINEPHrine (ADRENALIN) 1 MG/10ML injection 0.3 mg (0.3 mg Intramuscular Given 01/31/19 2220)  diphenhydrAMINE (BENADRYL) injection 50 mg (50 mg Intravenous Given 01/31/19 2219)  sodium chloride 0.9 % bolus 1,000 mL (0 mLs Intravenous Stopped 01/31/19 2336)  LORazepam (ATIVAN) injection 2 mg (2 mg Intravenous Given 01/31/19 2219)  sodium chloride 0.9 % bolus 1,000 mL (0 mLs Intravenous Stopped 02/01/19 0045)  ceFEPIme (MAXIPIME) 2 g in sodium chloride 0.9 % 100 mL IVPB (0 g Intravenous Stopped 02/01/19 0044)  metroNIDAZOLE (FLAGYL) IVPB 500 mg (0 mg Intravenous Stopped 02/01/19 0152)  vancomycin (VANCOCIN) IVPB 1000 mg/200 mL premix (0 mg Intravenous Stopped 02/01/19 0333)  sodium chloride 0.9 % bolus 1,770 mL (0 mL/kg  59 kg Intravenous Stopped 02/01/19 0343)    Mobility walks with person assist

## 2019-02-01 NOTE — Progress Notes (Signed)
MD has been made aware that patient still wants to leave [facility] after being advised that she is asssuming responsibility for any adverse events related to leaving. Patient  Left unit  with female friend.

## 2019-02-01 NOTE — Progress Notes (Addendum)
Patient requesting that nurse call the physician to inform him that she would like to go home today because she feels better and she has an appointment with the  Neurosurgeon on Monday morning. MD made aware and feels that she is not ready for discharge. Patient state that she still wants to leave because she cant miss her appointment.

## 2019-02-01 NOTE — ED Notes (Signed)
Patient resting at this time with no complaints. Patient does toss and turn in bed but states that she has bad anxiety. Will continue to monitor patient.

## 2019-02-01 NOTE — ED Notes (Signed)
One set of blood cultures obtained prior to starting antibiotics. Only one set obtained due to patient being a difficult stick.

## 2019-02-01 NOTE — ED Notes (Signed)
Patient transported to CT 

## 2019-02-01 NOTE — Discharge Summary (Signed)
Physician Discharge Summary  Paula Haley [id number] DOB: 06/15/2000 DOA: 01/31/2019  PCP: Patient, No Pcp Per  Admit date: 01/31/2019 Discharge date: 02/01/2019  Admitted From: Home Disposition: Left AMA  Recommendations for Outpatient Follow-up:  1. Follow up with PCP in 1-2 weeks 2. Follow-up with Neurosurgery as an outpatient 3. Follow-up with Infectious diseases for further antibiotic management 4. Please obtain CMP/CBC, Mag, Phos in one week 5. Please follow up on the following pending results:  Home Health: No Equipment/Devices: None    Discharge Condition: Guarded  CODE STATUS: FULL CODE Diet recommendation: Regular Diet   Brief/Interim Summary: HPI per Dr. Gala Romney on 02/01/2019 Paula Haley is a 18 y.o. 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  The sepsis happened about a month ago when her VP shunt was removed in [place].  Patient came to the ER due to presyncopal episode with diffuse rash.  She was suspected to have drug reaction.  Patient however was noted to also have low-grade temperature and evidence of SIRS.  She denied any new medications.  She received epi IM in the emergency room.  No drug abuse.  She is slightly weak and patient is therefore being admitted for treatment of possible sepsis with drug reaction.  ED Course: Temperature is 98.2, blood pressure 140/125, pulse 164, respiratory 28 on oxygen sats 99% on room air.  White count is 12.5, plan 17.7 and platelets 844.  Sodium 134 potassium 3.5 chloride 101 CO2 16 BUN 10 creatinine 0.9 and calcium 9.2 glucose 125.  Patient is being admitted for close work-up.  **Interim History Further discussion with the patient states that she had an abscess after her VP shunt removal on her back and she was placed on  Bactrim and Augmentin at discharge.  Had an adverse reaction to 1 of these antibiotics and she states she started to feel better but still had a acidosis and a little bit of a rash.  I felt the patient still needed further treatment and work-up and infectious disease was to be formally consulted as was obtaining medical records however patient did not want to stay because she had an appointment with her neurosurgeon in the a.m.  Advised her that I felt that she still needed further care while hospitalized to get her on a right antibiotic regimen however she declined and understood the risks of leaving [facility] including worsening, decompensation and even possible death.  She understood these risks and was of sound mind when she made a decision to leave [facility].   Discharge Diagnoses:  Principal Problem:   SIRS (systemic inflammatory response syndrome) (HCC) Active Problems:   Allergic reaction   S/P VP shunt  SIRS: No evidence of infection but has symptoms and has an abscess in the back that she is being treated for.  Patient will be admitted for supportive care.  Empirically started on antibiotics with IV cefepime.  We will get culture results and monitor.  Has been on oral antibiotics with Bactrim and Augmentin.  Was going to get further records and discuss with infectious diseases for formal consultation to get the patient on an appropriate antibiotic regimen however patient decided to leave AGAINST MEDICAL ADVICE  Possible drug reaction: Possibly to Bactrim or penicillin related products but unlikely secondary to Augmentin given that she is tolerated cefepime  without issue.  No no sulfa allergies.  Patient has received epi.  Will monitor closely and consider steroids and Benadryl if indicated.  Unfortunately patient left AGAINST MEDICAL ADVICE while she was still being treated for drug reaction  History of VP shunt: At this point it has been removed.  Patient will  revert to her surgeons for any further treatment.  Patient is leaving [place] and is following up with neurosurgeon  Presyncope: Reported presyncopal episodes.  Dehydrated.  Aggressively hydrate.  Was going to get an echo to make sure we were not missing anything however patient left AGAINST MEDICAL ADVICE  Normocytic Anemia: Patient's hemoglobin/hematocrit had dropped from admission as she is hemoconcentrated and was now 11.9/36.4.  Wanted to get an anemia panel and repeat CBC in a.m. however patient left AGAINST MEDICAL ADVICE  Metabolic Acidosis: His CO2 this morning was 17 and chloride was 112.  Change fluids to sodium bicarbonate 150 mEq at a rate of 100 mL's per hour but patient left AGAINST MEDICAL ADVICE  Discharge Instructions  Allergies as of 02/01/2019   No Known Allergies     Medication List    STOP taking these medications   sulfamethoxazole-trimethoprim 800-160 MG tablet Commonly known as: BACTRIM DS     TAKE these medications   amoxicillin-clavulanate 875-125 MG tablet Commonly known as: AUGMENTIN Take 1 tablet by mouth 2 (two) times daily.   ibuprofen 200 MG tablet Commonly known as: ADVIL Take 600 mg by mouth every 6 (six) hours as needed for moderate pain.   metroNIDAZOLE 500 MG tablet Commonly known as: FLAGYL Take 1 tablet (500 mg total) by mouth 2 (two) times daily.       No Known Allergies  Consultations:  Discussed with ID   Procedures/Studies: Ct Head Wo Contrast  Result Date: 02/01/2019 CLINICAL DATA:  Altered level consciousness. History of VP shunt post removal EXAM: CT HEAD WITHOUT CONTRAST TECHNIQUE: Contiguous axial images were obtained from the base of the skull through the vertex without intravenous contrast. COMPARISON:  None. FINDINGS: Brain: No intracranial hemorrhage, mass effect, or midline shift. No hydrocephalus. No visualized white matter tract to suggest prior VP shunt. The basilar cisterns are patent. No evidence  of territorial infarct or acute ischemia. No extra-axial or intracranial fluid collection. Vascular: No hyperdense vessel or unexpected calcification. Skull: No fracture or focal lesion. Sinuses/Orbits: Paranasal sinuses and mastoid air cells are clear. The visualized orbits are unremarkable. Other: Scalp soft tissues are unremarkable. IMPRESSION: No acute intracranial abnormality. Electronically Signed   By: Keith Rake M.D.   On: 02/01/2019 01:22   Ct Lumbar Spine Wo Contrast  Result Date: 02/01/2019 CLINICAL DATA:  Initial evaluation for acute back pain, recent surgery. EXAM: CT LUMBAR SPINE WITHOUT CONTRAST TECHNIQUE: Multidetector CT imaging of the lumbar spine was performed without intravenous contrast administration. Multiplanar CT image reconstructions were also generated. COMPARISON:  None available. FINDINGS: Segmentation: Standard. Lowest well-formed disc labeled the L5-S1 level. Alignment: Physiologic with preservation of the normal lumbar lordosis. No listhesis. Vertebrae: Vertebral body height maintained without evidence for acute or chronic fracture. Visualized sacrum and pelvis intact. SI joints approximated symmetric. No discrete osseous lesions. Paraspinal and other soft tissues: Loculated somewhat thick-walled collection seen just to the right of midline within the posterior subcutaneous fat at the level of L2-3. This measures approximately 3.4 x 7.8 x 3.3 cm (series 4, image 55). Small focal area of subfascial extension noted towards the L2 spinous process (a series 4, image 58). Finding  indeterminate, and could reflect a benign postoperative seroma. Superimposed infection not excluded, and could be considered in the correct clinical setting. Paraspinous soft tissues otherwise unremarkable. Visualized intraabdominal contents within normal limits. Disc levels: No significant disc pathology seen within the lumbar spine. No canal or neural foraminal stenosis. No appreciable intraspinal or  epidural collections on this noncontrast examination. IMPRESSION: 1. 3.4 x 7.8 x 3.3 cm loculated collection within the posterior subcutaneous fat at the level of L2-3 as above. Finding is indeterminate, and could reflect a benign postoperative seroma. Superimposed infection not excluded, and could be considered in the correct clinical setting. 2. No other acute abnormality within the lumbar spine. Electronically Signed   By: Jeannine Boga M.D.   On: 02/01/2019 01:00   Dg Chest Port 1 View  Result Date: 01/31/2019 CLINICAL DATA:  Syncope EXAM: PORTABLE CHEST 1 VIEW COMPARISON:  None. FINDINGS: The heart size and mediastinal contours are within normal limits. Both lungs are clear. The visualized skeletal structures are unremarkable. IMPRESSION: No active disease. Electronically Signed   By: Donavan Foil M.D.   On: 01/31/2019 22:29    Subjective: Seen and examined at bedside and was asking to leave this morning however I felt she is still needed inpatient further treatment and work-up given her drug reaction and her being on antibiotics for her abscess.  Was going to call ID for a formal consultation and repeat blood work and echocardiogram however she decided to leave [facility].  She understood the risks of leaving [place] and was of sound mind when she made the decision to leave.  Discharge Exam: Vitals:   02/01/19 0622 02/01/19 1230  BP: 105/76 119/88  Pulse: 83 79  Resp: 17 16  Temp: (!) 97.5 F (36.4 C) 97.9 F (36.6 C)  SpO2: 100% 100%   Vitals:   02/01/19 0430 02/01/19 0500 02/01/19 0622 02/01/19 1230  BP: 114/72 112/72 105/76 119/88  Pulse: 80 78 83 79  Resp: 19 20 17 16   Temp:   (!) 97.5 F (36.4 C) 97.9 F (36.6 C)  TempSrc:   Oral Oral  SpO2: 99% 99% 100% 100%  Weight:      Height:       General: Pt is alert, awake, not in acute distress Cardiovascular: RRR, S1/S2 +, no rubs, no gallops Respiratory: Mildly diminished bilaterally, no  wheezing, no rhonchi Abdominal: Soft, NT, ND, bowel sounds + Extremities: no edema, no cyanosis; multiple tattoos noted and has a slight rash  The results of significant diagnostics from this hospitalization (including imaging, microbiology, ancillary and laboratory) are listed below for reference.    Microbiology: Recent Results (from the past 240 hour(s))  SARS Coronavirus 2 [facility] order, Performed in [facility] hospital lab) Nasopharyngeal Nasopharyngeal Swab     Status: None   Collection Time: 02/01/19 12:00 AM   Specimen: Nasopharyngeal Swab  Result Value Ref Range Status   SARS Coronavirus 2 NEGATIVE NEGATIVE Final    Comment: (NOTE) If result is NEGATIVE SARS-CoV-2 target nucleic acids are NOT DETECTED. The SARS-CoV-2 RNA is generally detectable in upper and lower  respiratory specimens during the acute phase of infection. The lowest  concentration of SARS-CoV-2 viral copies this assay can detect is 250  copies / mL. A negative result does not preclude SARS-CoV-2 infection  and should not be used as the sole basis for treatment or other  patient management decisions.  A negative result may occur with  improper specimen collection / handling, submission  of specimen other  than nasopharyngeal swab, presence of viral mutation(s) within the  areas targeted by this assay, and inadequate number of viral copies  (<250 copies / mL). A negative result must be combined with clinical  observations, patient history, and epidemiological information. If result is POSITIVE SARS-CoV-2 target nucleic acids are DETECTED. The SARS-CoV-2 RNA is generally detectable in upper and lower  respiratory specimens dur ing the acute phase of infection.  Positive  results are indicative of active infection with SARS-CoV-2.  Clinical  correlation with patient history and other diagnostic information is  necessary to determine patient infection status.  Positive results do  not rule out bacterial  infection or co-infection with other viruses. If result is PRESUMPTIVE POSTIVE SARS-CoV-2 nucleic acids MAY BE PRESENT.   A presumptive positive result was obtained on the submitted specimen  and confirmed on repeat testing.  While 2019 novel coronavirus  (SARS-CoV-2) nucleic acids may be present in the submitted sample  additional confirmatory testing may be necessary for epidemiological  and / or clinical management purposes  to differentiate between  SARS-CoV-2 and other Sarbecovirus currently known to infect humans.  If clinically indicated additional testing with an alternate test  methodology [phone]) is advised. The SARS-CoV-2 RNA is generally  detectable in upper and lower respiratory sp ecimens during the acute  phase of infection. The expected result is Negative. Fact Sheet for Patients:  StrictlyIdeas.no Fact Sheet for Healthcare Providers: BankingDealers.co.za This test is not yet approved or cleared by the [place] FDA and has been authorized for detection and/or diagnosis of SARS-CoV-2 by FDA under an Emergency Use Authorization (EUA).  This EUA will remain in effect (meaning this test can be used) for the duration of the COVID-19 declaration under Section 564(b)(1) of the Act, 21 U.S.C. section 360bbb-3(b)(1), unless the authorization is terminated or revoked sooner. Performed at [facility], [place] [address] [place], [place] [address]     Labs: BNP (last 3 results) No results for input(s): BNP in the last 8760 hours. Basic Metabolic Panel: Recent Labs  Lab 01/31/19 2155 01/31/19 2218 02/01/19 0628 02/01/19 0843  NA 134* 134* 137  --   K 3.6 3.5 4.4  --   CL 102 101 112*  --   CO2 16*  --  17*  --   GLUCOSE 130* 125* 138*  --   BUN 11 10 10   --   CREATININE 1.09* 0.90 0.58  --   CALCIUM 9.1  --  7.8*  --   MG  --   --   --  2.0  PHOS  --   --   --  3.4   Liver Function  Tests: Recent Labs  Lab 01/31/19 2155 02/01/19 0628  AST 15 10*  ALT 9 8  ALKPHOS 43 32*  BILITOT 0.9 0.6  PROT 7.1 5.5*  ALBUMIN 3.8 2.9*   No results for input(s): LIPASE, AMYLASE in the last 168 hours. No results for input(s): AMMONIA in the last 168 hours. CBC: Recent Labs  Lab 01/31/19 2155 01/31/19 2218 02/01/19 0628  WBC 12.5*  --  7.0  NEUTROABS 6.4  --   --   HGB 17.0* 17.7* 11.9*  HCT 51.3* 52.0* 36.4  MCV 86.4  --  88.3  PLT 844*  --  416*   Cardiac Enzymes: Recent Labs  Lab 01/31/19 2155  CKTOTAL 13*   BNP: Invalid input(s): POCBNP CBG: No results for input(s): GLUCAP in the last 168 hours. D-Dimer  No results for input(s): DDIMER in the last 72 hours. Hgb A1c No results for input(s): HGBA1C in the last 72 hours. Lipid Profile No results for input(s): CHOL, HDL, LDLCALC, TRIG, CHOLHDL, LDLDIRECT in the last 72 hours. Thyroid function studies No results for input(s): TSH, T4TOTAL, T3FREE, THYROIDAB in the last 72 hours.  Invalid input(s): FREET3 Anemia work up No results for input(s): VITAMINB12, FOLATE, FERRITIN, TIBC, IRON, RETICCTPCT in the last 72 hours. Urinalysis    Component Value Date/Time   COLORURINE AMBER (A) 01/31/2019 2338   APPEARANCEUR HAZY (A) 01/31/2019 2338   LABSPEC 1.029 01/31/2019 2338   PHURINE 5.0 01/31/2019 [place] 01/31/2019 2338   HGBUR NEGATIVE 01/31/2019 2338   BILIRUBINUR NEGATIVE 01/31/2019 2338   KETONESUR 20 (A) 01/31/2019 2338   PROTEINUR 30 (A) 01/31/2019 2338   UROBILINOGEN 0.2 09/16/2018 1127   NITRITE NEGATIVE 01/31/2019 2338   LEUKOCYTESUR NEGATIVE 01/31/2019 2338   Sepsis Labs Invalid input(s): PROCALCITONIN,  WBC,  LACTICIDVEN Microbiology Recent Results (from the past 240 hour(s))  SARS Coronavirus 2 [facility] order, Performed in [facility] hospital lab) Nasopharyngeal Nasopharyngeal Swab     Status: None   Collection Time: 02/01/19 12:00 AM   Specimen: Nasopharyngeal Swab   Result Value Ref Range Status   SARS Coronavirus 2 NEGATIVE NEGATIVE Final    Comment: (NOTE) If result is NEGATIVE SARS-CoV-2 target nucleic acids are NOT DETECTED. The SARS-CoV-2 RNA is generally detectable in upper and lower  respiratory specimens during the acute phase of infection. The lowest  concentration of SARS-CoV-2 viral copies this assay can detect is 250  copies / mL. A negative result does not preclude SARS-CoV-2 infection  and should not be used as the sole basis for treatment or other  patient management decisions.  A negative result may occur with  improper specimen collection / handling, submission of specimen other  than nasopharyngeal swab, presence of viral mutation(s) within the  areas targeted by this assay, and inadequate number of viral copies  (<250 copies / mL). A negative result must be combined with clinical  observations, patient history, and epidemiological information. If result is POSITIVE SARS-CoV-2 target nucleic acids are DETECTED. The SARS-CoV-2 RNA is generally detectable in upper and lower  respiratory specimens dur ing the acute phase of infection.  Positive  results are indicative of active infection with SARS-CoV-2.  Clinical  correlation with patient history and other diagnostic information is  necessary to determine patient infection status.  Positive results do  not rule out bacterial infection or co-infection with other viruses. If result is PRESUMPTIVE POSTIVE SARS-CoV-2 nucleic acids MAY BE PRESENT.   A presumptive positive result was obtained on the submitted specimen  and confirmed on repeat testing.  While 2019 novel coronavirus  (SARS-CoV-2) nucleic acids may be present in the submitted sample  additional confirmatory testing may be necessary for epidemiological  and / or clinical management purposes  to differentiate between  SARS-CoV-2 and other Sarbecovirus currently known to infect humans.  If clinically indicated additional  testing with an alternate test  methodology [phone]) is advised. The SARS-CoV-2 RNA is generally  detectable in upper and lower respiratory sp ecimens during the acute  phase of infection. The expected result is Negative. Fact Sheet for Patients:  StrictlyIdeas.no Fact Sheet for Healthcare Providers: BankingDealers.co.za This test is not yet approved or cleared by the [place] FDA and has been authorized for detection and/or diagnosis of SARS-CoV-2 by FDA under an Emergency Use Authorization (EUA).  This EUA will remain in effect (meaning this test can be used) for the duration of the COVID-19 declaration under Section 564(b)(1) of the Act, 21 U.S.C. section 360bbb-3(b)(1), unless the authorization is terminated or revoked sooner. Performed at [facility], [place] [address], [place], [place] [address]    Time coordinating discharge: 25 minutes  SIGNED:  Kerney Elbe, DO [facility] 02/01/2019, 5:36 PM Pager is on [place]  If 7PM-7AM, please contact night-coverage [email or web address] Password [email or web address]

## 2019-02-01 NOTE — H&P (Signed)
History and Physical   Paula Haley [id number] DOB: 09/15/2000 DOA: 01/31/2019  Referring MD/NP/PA: Dr. Nicholes Stairs  PCP: Patient, No Pcp Per   Outpatient Specialists: None  Patient coming from: Home  Chief Complaint: Rashes  HPI: Paula Haley is a 17 y.o. 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  The sepsis happened about a month ago when her VP shunt was removed in [place].  Patient came to the ER due to presyncopal episode with diffuse rash.  She was suspected to have drug reaction.  Patient however was noted to also have low-grade temperature and evidence of SIRS.  She denied any new medications.  She received epi IM in the emergency room.  No drug abuse.  She is slightly weak and patient is therefore being admitted for treatment of possible sepsis with drug reaction.  ED Course: Temperature is 98.2, blood pressure 140/125, pulse 164, respiratory 28 on oxygen sats 99% on room air.  White count is 12.5, plan 17.7 and platelets 844.  Sodium 134 potassium 3.5 chloride 101 CO2 16 BUN 10 creatinine 0.9 and calcium 9.2 glucose 125.  Patient is being admitted for close work-up.  Review of Systems: As per HPI otherwise 10 point review of systems negative.    History reviewed. No pertinent past medical history.  Past Surgical History:  Procedure Laterality Date   VENTRICULOPERITONEAL SHUNT  2020     reports that she has never smoked. She has never used smokeless tobacco. She reports that she does not drink alcohol. No history on file for drug.  No Known Allergies  Family History  Problem Relation Age of Onset   Healthy Mother    Healthy Father      Prior to Admission medications   Medication Sig Start Date End Date Taking? Authorizing Provider  amoxicillin-clavulanate (AUGMENTIN)  875-125 MG tablet Take 1 tablet by mouth 2 (two) times daily.  01/25/19  Yes [provider]  ibuprofen (ADVIL) 200 MG tablet Take 600 mg by mouth every 6 (six) hours as needed for moderate pain.   Yes [provider]  sulfamethoxazole-trimethoprim (BACTRIM DS) 800-160 MG tablet Take 1 tablet by mouth 2 (two) times daily. for 10 days 01/25/19  Yes [provider]  metroNIDAZOLE (FLAGYL) 500 MG tablet Take 1 tablet (500 mg total) by mouth 2 (two) times daily. Patient not taking: Reported on 01/31/2019 09/16/18   Orvan July, NP    Physical Exam: Vitals:   02/01/19 0242 02/01/19 0330 02/01/19 0400 02/01/19 0430  BP: 120/78 111/71 115/71 114/72  Pulse: 98 84 78 80  Resp: 20 20 17 19   Temp:      TempSrc:      SpO2: 100% 99% 99% 99%  Weight:      Height:          Constitutional: NAD, calm, sleepy Vitals:   02/01/19 0242 02/01/19 0330 02/01/19 0400 02/01/19 0430  BP: 120/78 111/71 115/71 114/72  Pulse: 98 84 78 80  Resp: 20 20 17 19   Temp:      TempSrc:      SpO2: 100% 99% 99% 99%  Weight:      Height:       Eyes: PERRL, lids and conjunctivae normal ENMT: Mucous membranes are moist. Posterior pharynx clear of any exudate or lesions.Normal dentition.  Neck: normal, supple, no  masses, no thyromegaly Respiratory: clear to auscultation bilaterally, no wheezing, no crackles. Normal respiratory effort. No accessory muscle use.  Cardiovascular: Sinus tachycardia, no murmurs / rubs / gallops. No extremity edema. 2+ pedal pulses. No carotid bruits.  Abdomen: no tenderness, no masses palpated. No hepatosplenomegaly. Bowel sounds positive.  Musculoskeletal: no clubbing / cyanosis. No joint deformity upper and lower extremities. Good ROM, no contractures. Normal muscle tone.  Skin: Diffuse purpuric rashes, lesions, ulcers. No induration Neurologic: CN 2-12 grossly intact. Sensation intact, DTR normal. Strength 5/5 in all 4.  Psychiatric: Normal judgment and insight.  Alert and oriented x 3. Normal mood.     Labs on Admission: I have personally reviewed following labs and imaging studies  CBC: Recent Labs  Lab 01/31/19 2155 01/31/19 2218  WBC 12.5*  --   NEUTROABS 6.4  --   HGB 17.0* 17.7*  HCT 51.3* 52.0*  MCV 86.4  --   PLT 844*  --    Basic Metabolic Panel: Recent Labs  Lab 01/31/19 2155 01/31/19 2218  NA 134* 134*  K 3.6 3.5  CL 102 101  CO2 16*  --   GLUCOSE 130* 125*  BUN 11 10  CREATININE 1.09* 0.90  CALCIUM 9.1  --    GFR: Estimated Creatinine Clearance: 93.6 mL/min (by C-G formula based on SCr of 0.9 mg/dL). Liver Function Tests: Recent Labs  Lab 01/31/19 2155  AST 15  ALT 9  ALKPHOS 43  BILITOT 0.9  PROT 7.1  ALBUMIN 3.8   No results for input(s): LIPASE, AMYLASE in the last 168 hours. No results for input(s): AMMONIA in the last 168 hours. Coagulation Profile: No results for input(s): INR, PROTIME in the last 168 hours. Cardiac Enzymes: Recent Labs  Lab 01/31/19 2155  CKTOTAL 13*   BNP (last 3 results) No results for input(s): PROBNP in the last 8760 hours. HbA1C: No results for input(s): HGBA1C in the last 72 hours. CBG: No results for input(s): GLUCAP in the last 168 hours. Lipid Profile: No results for input(s): CHOL, HDL, LDLCALC, TRIG, CHOLHDL, LDLDIRECT in the last 72 hours. Thyroid Function Tests: No results for input(s): TSH, T4TOTAL, FREET4, T3FREE, THYROIDAB in the last 72 hours. Anemia Panel: No results for input(s): VITAMINB12, FOLATE, FERRITIN, TIBC, IRON, RETICCTPCT in the last 72 hours. Urine analysis:    Component Value Date/Time   COLORURINE AMBER (A) 01/31/2019 2338   APPEARANCEUR HAZY (A) 01/31/2019 2338   LABSPEC 1.029 01/31/2019 2338   PHURINE 5.0 01/31/2019 [place] 01/31/2019 2338   HGBUR NEGATIVE 01/31/2019 2338   BILIRUBINUR NEGATIVE 01/31/2019 2338   KETONESUR 20 (A) 01/31/2019 2338   PROTEINUR 30 (A) 01/31/2019 2338   UROBILINOGEN 0.2 09/16/2018  1127   NITRITE NEGATIVE 01/31/2019 2338   LEUKOCYTESUR NEGATIVE 01/31/2019 2338   Sepsis Labs: @LABRCNTIP (procalcitonin:4,lacticidven:4) ) Recent Results (from the past 240 hour(s))  SARS Coronavirus 2 [facility] order, Performed in [facility] hospital lab) Nasopharyngeal Nasopharyngeal Swab     Status: None   Collection Time: 02/01/19 12:00 AM   Specimen: Nasopharyngeal Swab  Result Value Ref Range Status   SARS Coronavirus 2 NEGATIVE NEGATIVE Final    Comment: (NOTE) If result is NEGATIVE SARS-CoV-2 target nucleic acids are NOT DETECTED. The SARS-CoV-2 RNA is generally detectable in upper and lower  respiratory specimens during the acute phase of infection. The lowest  concentration of SARS-CoV-2 viral copies this assay can detect is 250  copies / mL. A negative result does not preclude SARS-CoV-2 infection  and should not be used as the sole basis for treatment or other  patient management decisions.  A negative result may occur with  improper specimen collection / handling, submission of specimen other  than nasopharyngeal swab, presence of viral mutation(s) within the  areas targeted by this assay, and inadequate number of viral copies  (<250 copies / mL). A negative result must be combined with clinical  observations, patient history, and epidemiological information. If result is POSITIVE SARS-CoV-2 target nucleic acids are DETECTED. The SARS-CoV-2 RNA is generally detectable in upper and lower  respiratory specimens dur ing the acute phase of infection.  Positive  results are indicative of active infection with SARS-CoV-2.  Clinical  correlation with patient history and other diagnostic information is  necessary to determine patient infection status.  Positive results do  not rule out bacterial infection or co-infection with other viruses. If result is PRESUMPTIVE POSTIVE SARS-CoV-2 nucleic acids MAY BE PRESENT.   A presumptive positive result was obtained on the  submitted specimen  and confirmed on repeat testing.  While 2019 novel coronavirus  (SARS-CoV-2) nucleic acids may be present in the submitted sample  additional confirmatory testing may be necessary for epidemiological  and / or clinical management purposes  to differentiate between  SARS-CoV-2 and other Sarbecovirus currently known to infect humans.  If clinically indicated additional testing with an alternate test  methodology [phone]) is advised. The SARS-CoV-2 RNA is generally  detectable in upper and lower respiratory sp ecimens during the acute  phase of infection. The expected result is Negative. Fact Sheet for Patients:  StrictlyIdeas.no Fact Sheet for Healthcare Providers: BankingDealers.co.za This test is not yet approved or cleared by the [place] FDA and has been authorized for detection and/or diagnosis of SARS-CoV-2 by FDA under an Emergency Use Authorization (EUA).  This EUA will remain in effect (meaning this test can be used) for the duration of the COVID-19 declaration under Section 564(b)(1) of the Act, 21 U.S.C. section 360bbb-3(b)(1), unless the authorization is terminated or revoked sooner. Performed at [facility], [place] [address] [place], [place] [address]      Radiological Exams on Admission: Ct Head Wo Contrast  Result Date: 02/01/2019 CLINICAL DATA:  Altered level consciousness. History of VP shunt post removal EXAM: CT HEAD WITHOUT CONTRAST TECHNIQUE: Contiguous axial images were obtained from the base of the skull through the vertex without intravenous contrast. COMPARISON:  None. FINDINGS: Brain: No intracranial hemorrhage, mass effect, or midline shift. No hydrocephalus. No visualized white matter tract to suggest prior VP shunt. The basilar cisterns are patent. No evidence of territorial infarct or acute ischemia. No extra-axial or intracranial fluid collection. Vascular: No  hyperdense vessel or unexpected calcification. Skull: No fracture or focal lesion. Sinuses/Orbits: Paranasal sinuses and mastoid air cells are clear. The visualized orbits are unremarkable. Other: Scalp soft tissues are unremarkable. IMPRESSION: No acute intracranial abnormality. Electronically Signed   By: Keith Rake M.D.   On: 02/01/2019 01:22   Ct Lumbar Spine Wo Contrast  Result Date: 02/01/2019 CLINICAL DATA:  Initial evaluation for acute back pain, recent surgery. EXAM: CT LUMBAR SPINE WITHOUT CONTRAST TECHNIQUE: Multidetector CT imaging of the lumbar spine was performed without intravenous contrast administration. Multiplanar CT image reconstructions were also generated. COMPARISON:  None available. FINDINGS: Segmentation: Standard. Lowest well-formed disc labeled the L5-S1 level. Alignment: Physiologic with preservation of the normal lumbar lordosis. No listhesis. Vertebrae: Vertebral body height maintained without evidence for acute or chronic fracture. Visualized sacrum and  pelvis intact. SI joints approximated symmetric. No discrete osseous lesions. Paraspinal and other soft tissues: Loculated somewhat thick-walled collection seen just to the right of midline within the posterior subcutaneous fat at the level of L2-3. This measures approximately 3.4 x 7.8 x 3.3 cm (series 4, image 55). Small focal area of subfascial extension noted towards the L2 spinous process (a series 4, image 58). Finding indeterminate, and could reflect a benign postoperative seroma. Superimposed infection not excluded, and could be considered in the correct clinical setting. Paraspinous soft tissues otherwise unremarkable. Visualized intraabdominal contents within normal limits. Disc levels: No significant disc pathology seen within the lumbar spine. No canal or neural foraminal stenosis. No appreciable intraspinal or epidural collections on this noncontrast examination. IMPRESSION: 1. 3.4 x 7.8 x 3.3 cm loculated  collection within the posterior subcutaneous fat at the level of L2-3 as above. Finding is indeterminate, and could reflect a benign postoperative seroma. Superimposed infection not excluded, and could be considered in the correct clinical setting. 2. No other acute abnormality within the lumbar spine. Electronically Signed   By: Jeannine Boga M.D.   On: 02/01/2019 01:00   Dg Chest Port 1 View  Result Date: 01/31/2019 CLINICAL DATA:  Syncope EXAM: PORTABLE CHEST 1 VIEW COMPARISON:  None. FINDINGS: The heart size and mediastinal contours are within normal limits. Both lungs are clear. The visualized skeletal structures are unremarkable. IMPRESSION: No active disease. Electronically Signed   By: Donavan Foil M.D.   On: 01/31/2019 22:29    EKG: Independently reviewed.  Shows sinus tachycardia with a rate of 164 and multiple other abnormalities.  Assessment/Plan Principal Problem:   SIRS (systemic inflammatory response syndrome) (HCC) Active Problems:   Allergic reaction   S/P VP shunt     #1 SIRS: No evidence of infection but has symptoms.  Patient will be admitted for supportive care.  Empirically started on antibiotics.  We will get culture results and monitor.  Has been on oral antibiotics.  #2 possible drug reaction: Possibly to Bactrim or penicillin related products.  No no sulfa allergies.  Patient has received epi.  Will monitor closely and consider steroids and Benadryl if indicated.  #3 history of VP shunt: At this point it has been removed.  Patient will revert to her surgeons for any further treatment.  #4 presyncope: Reported presyncopal episodes.  Dehydrated.  Aggressively hydrate   DVT prophylaxis: SCD Code Status: Full code Family Communication: No family at bedside Disposition Plan: Home Consults called: None Admission status: Observation  Severity of Illness: The appropriate patient status for this patient is OBSERVATION. Observation status is judged to be  reasonable and necessary in order to provide the required intensity of service to ensure the patient's safety. The patient's presenting symptoms, physical exam findings, and initial radiographic and laboratory data in the context of their medical condition is felt to place them at decreased risk for further clinical deterioration. Furthermore, it is anticipated that the patient will be medically stable for discharge from the hospital within 2 midnights of admission. The following factors support the patient status of observation.   " The patient's presenting symptoms include rashes and presyncope. " The physical exam findings include no significant findings on exam except for tachypnea. " The initial radiographic and laboratory data are glucose cytosis and thrombocytopenia.     Barbette Merino MD [facility] Pager [phone]  If 7PM-7AM, please contact night-coverage [email or web address] Password [facility]  02/01/2019, 4:52 AM

## 2019-02-01 NOTE — Progress Notes (Signed)
Pharmacy Antibiotic Note  Sarada Devanney is a 18 y.o. female admitted on 01/31/2019 with SIRS.  Pharmacy has been consulted for Cefepime dosing.  Plan: Cefepime 2gm iv q8hr  Height: 5\' 7"  (170.2 cm) Weight: 130 lb (59 kg) IBW/kg (Calculated) : 61.6  Temp (24hrs), Avg:98.2 F (36.8 C), Min:98.2 F (36.8 C), Max:98.2 F (36.8 C)  Recent Labs  Lab 01/31/19 2155 01/31/19 2218 02/01/19 0000  WBC 12.5*  --   --   CREATININE 1.09* 0.90  --   LATICACIDVEN 3.7*  --  1.7    Estimated Creatinine Clearance: 93.6 mL/min (by C-G formula based on SCr of 0.9 mg/dL).    No Known Allergies  Antimicrobials this admission: Vancomycin 02/01/2019 >> Cefepime 02/01/2019 >>    Dose adjustments this admission: -  Microbiology results: -  Thank you for allowing pharmacy to be a part of this patient's care.  Nani Skillern Crowford 02/01/2019 5:22 AM

## 2019-02-01 NOTE — ED Notes (Signed)
Patient ambulated to restroom with minimal assistance. ?

## 2019-02-02 LAB — HIV ANTIBODY (ROUTINE TESTING W REFLEX): HIV Screen 4th Generation wRfx: NONREACTIVE

## 2019-02-02 LAB — URINE CULTURE: Culture: NO GROWTH

## 2019-02-06 LAB — CULTURE, BLOOD (ROUTINE X 2)
Culture: NO GROWTH
Special Requests: ADEQUATE

## 2019-05-01 ENCOUNTER — Emergency Department (HOSPITAL_COMMUNITY)
Admission: EM | Admit: 2019-05-01 | Discharge: 2019-05-01 | Discharge status: Against Medical Advice | Attending: Emergency Medicine | Admitting: Emergency Medicine

## 2019-05-01 ENCOUNTER — Other Ambulatory Visit: Payer: Self-pay

## 2019-05-01 ENCOUNTER — Encounter (HOSPITAL_COMMUNITY): Payer: Self-pay

## 2019-05-01 DIAGNOSIS — L509 Urticaria, unspecified: Secondary | ICD-10-CM | POA: Diagnosis not present

## 2019-05-01 DIAGNOSIS — T7840XA Allergy, unspecified, initial encounter: Secondary | ICD-10-CM | POA: Diagnosis not present

## 2019-05-01 LAB — I-STAT BETA HCG BLOOD, ED (MC, WL, AP ONLY): I-stat hCG, quantitative: <5 m[IU]/mL (ref <5)

## 2019-05-01 MED ORDER — METHYLPREDNISOLONE SODIUM SUCC 125 MG IJ SOLR
125.0000 mg | Freq: Once | INTRAMUSCULAR | Status: AC
Start: 2019-05-01 — End: 2019-05-01
  Administered 2019-05-01: 09:00:00 125 mg via INTRAVENOUS
  Filled 2019-05-01: qty 2

## 2019-05-01 MED ORDER — EPINEPHRINE 0.3 MG/0.3ML IJ SOAJ: 0.3000 mg | Freq: Once | INTRAMUSCULAR | Status: DC

## 2019-05-01 MED ORDER — ONDANSETRON HCL 4 MG/2ML IJ SOLN
4.0000 mg | Freq: Once | INTRAMUSCULAR | Status: AC
Start: 2019-05-01 — End: 2019-05-01
  Administered 2019-05-01: 4 mg via INTRAVENOUS
  Filled 2019-05-01: qty 2

## 2019-05-01 MED ORDER — EPINEPHRINE 0.3 MG/0.3ML IJ SOAJ: 0.3000 mg | INTRAMUSCULAR | 1 refills | Status: DC | PRN

## 2019-05-01 MED ORDER — DIPHENHYDRAMINE HCL 50 MG/ML IJ SOLN
25.0000 mg | Freq: Once | INTRAMUSCULAR | Status: AC
Start: 2019-05-01 — End: 2019-05-01
  Administered 2019-05-01: 09:00:00 25 mg via INTRAVENOUS
  Filled 2019-05-01: qty 1

## 2019-05-01 MED ORDER — FAMOTIDINE IN NACL 20-0.9 MG/50ML-% IV SOLN
20.0000 mg | Freq: Once | INTRAVENOUS | Status: AC
  Administered 2019-05-01: 09:00:00 20 mg via INTRAVENOUS
  Filled 2019-05-01: qty 50

## 2019-05-01 MED ORDER — EPINEPHRINE 0.3 MG/0.3ML IJ SOAJ
INTRAMUSCULAR | Status: AC
  Filled 2019-05-01: qty 0.3

## 2019-05-01 MED ORDER — EPINEPHRINE 0.3 MG/0.3ML IJ SOAJ
0.3000 mg | Freq: Once | INTRAMUSCULAR | Status: AC
  Administered 2019-05-01: 09:00:00 0.3 mg via INTRAMUSCULAR

## 2019-05-01 NOTE — ED Notes (Signed)
Pt ambulatory to bathroom

## 2019-05-01 NOTE — ED Provider Notes (Signed)
[facility] DEPT Provider Note   CSN: [id number] Arrival date & time: 05/01/19  [id number]     History Chief Complaint  Patient presents with  . Allergic Reaction    Paula Haley is a 18 y.o. 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  Patient ports she woke up earlier this morning covered in hives.  She presents to the ED diffuse hives complain of itching all over.  She says her throat feels scratchy but not tight.  She does not feel short of breath or having difficulties breathing or speaking.  She said she has only had this kind of reaction once before in her life, and was treated in the ER for that.  She denies any known contacts with cats or animal dander in the past 24 hours.  She has not sure what may have triggered this reaction.  Denies vomiting or diarrhea.  Has nausea.  HPI     History reviewed. No pertinent past medical history.  Patient Active Problem List   Diagnosis Date Noted  . SIRS (systemic inflammatory response syndrome) ([place]) 02/01/2019  . Allergic reaction 02/01/2019  . 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 VENTRICULOPERITONEAL SHUNT  2020     OB History   No obstetric history on file.     Family History  Problem Relation Age of Onset  . Healthy Mother   . Healthy Father     Social History   Tobacco Use  . Smoking status: Never Smoker  . Smokeless tobacco: Never Used  Substance Use Topics  . Alcohol use: Never  . Drug use: Not on file    Home Medications Prior to Admission medications   Medication Sig Start Date End Date Taking? Authorizing Provider  EPINEPHrine (EPIPEN 2-PAK) 0.3 mg/0.3 mL IJ SOAJ injection Inject 0.3 mLs (0.3 mg total) into the muscle as needed for up to 2 doses for anaphylaxis. 05/01/19   Wyvonnia Dusky, MD    Allergies    Patient has no known allergies.  Review of Systems    Review of Systems  Constitutional: Negative for chills and fever.  HENT: Negative for sore throat, trouble swallowing and voice change.   Eyes: Negative for photophobia and visual disturbance.  Respiratory: Negative for cough, chest tightness, shortness of breath, wheezing and stridor.   Cardiovascular: Negative for chest pain and palpitations.  Gastrointestinal: Positive for nausea. Negative for vomiting.  Skin: Positive for color change and rash.  Neurological: Negative for seizures and syncope.  Psychiatric/Behavioral: Negative for agitation and confusion.  All other systems reviewed and are negative.   Physical Exam Updated Vital Signs BP 139/80   Pulse 76   Temp 98.3 F (36.8 C) (Oral)   Resp 16   SpO2 100%   Physical Exam Vitals and nursing note reviewed.  Constitutional:      General: She is not in acute distress.    Appearance: She is well-developed.  HENT:     Head: Normocephalic and atraumatic.     Comments: Oropharynx non-erythematous.  No tonsillar swelling or exudate.  No uvular deviation.  No drooling. No brawny edema. No stridor. Voice is not muffled.  Eyes:     Conjunctiva/sclera: Conjunctivae normal.  Cardiovascular:     Rate and Rhythm: Regular rhythm. Tachycardia present.     Heart sounds: No murmur.  Pulmonary:     Effort: Pulmonary effort  is normal. No respiratory distress.  Abdominal:     Palpations: Abdomen is soft.     Tenderness: There is no abdominal tenderness.  Musculoskeletal:     Cervical back: Neck supple.  Skin:    General: Skin is warm and dry.     Comments: Diffuse hives involving entire body but sparing the face  Neurological:     General: No focal deficit present.     Mental Status: She is alert. Mental status is at baseline.  Psychiatric:        Mood and Affect: Mood normal.        Behavior: Behavior normal.     ED Results / Procedures / Treatments   Labs (all labs ordered are listed, but only abnormal results are  displayed) Labs Reviewed  I-STAT BETA HCG BLOOD, ED ([facility], [facility], [facility] ONLY)    EKG None  Radiology No results found.  Procedures .Critical Care Performed by: Wyvonnia Dusky, MD Authorized by: Wyvonnia Dusky, MD   Critical care provider statement:    Critical care time (minutes):  30   Critical care was necessary to treat or prevent imminent or life-threatening deterioration of the following conditions: anaphylaxis.   Critical care was time spent personally by me on the following activities:  Discussions with consultants, evaluation of patient's response to treatment, examination of patient, ordering and performing treatments and interventions, ordering and review of laboratory studies, ordering and review of radiographic studies, pulse oximetry, re-evaluation of patient's condition, obtaining history from patient or surrogate and review of old charts Comments:     Anaphylaxis requiring epinephrine, IV medications, frequent reassessments   (including critical care time)  Medications Ordered in ED Medications  diphenhydrAMINE (BENADRYL) injection 25 mg (25 mg Intravenous Given 05/01/19 0923)  EPINEPHrine (EPI-PEN) injection 0.3 mg (0.3 mg Intramuscular Given 05/01/19 0900)  ondansetron (ZOFRAN) injection 4 mg (4 mg Intravenous Given 05/01/19 0923)  methylPREDNISolone sodium succinate (SOLU-MEDROL) 125 mg/2 mL injection 125 mg (125 mg Intravenous Given 05/01/19 0923)  famotidine (PEPCID) IVPB 20 mg premix (0 mg Intravenous Stopped 05/01/19 [id number])    ED Course  I have reviewed the triage vital signs and the nursing notes.  Pertinent labs & imaging results that were available during my care of the patient were reviewed by me and considered in my medical decision making (see chart for details).  18 year old female presenting to the ED with an allergic reaction.  Is not clear what set her off.  She woke up this morning covered in hives.  And came to the ED.  She does have diffuse hives  here and is tachycardic.  She feels nauseated.  Given that she has 2 system involvement, I believe it is reasonable to treat as anaphylaxis to give her a dose of IM epinephrine.  Also give her IV Benadryl and Solu-Medrol.  There is no evidence of airway compromise at this time.  She does report a feeling of mild scratchiness in her throat, but has no stridor, no muffled voice, no difficulty breathing, no wheezing on exam.  We will continue to monitor her.  Update - patient reported she did not want to wait here for monitoring and needed to catch a plane.  I explained that our standard of care is observation for a minimum of 4 hours to ensure she does not have a rebound reaction after her medications begin to wear off. She told the nurse she wants to leave AMA.  She understands the risks including severe rebound  reaction and death.  She will be signed out AMA, and I'll provide her an epi pen prescription.  Clinical Course as of Apr 30 1456  Fri May 01, 2019  0920 IM dose of epinephrine administered.   [MT]  [id number] Significant improvement in hives after her IM epinephrine.  Patient is feeling a lot better.  We will monitor her for 3 to 4 hours.   [MT]  [id number] Still stable, no hives   [MT]    Clinical Course User Index [MT] Solomia Harrell, Carola Rhine, MD    Final Clinical Impression(s) / ED Diagnoses Final diagnoses:  Allergic reaction, initial encounter  Hives    Rx / DC Orders ED Discharge Orders         Ordered    EPINEPHrine (EPIPEN 2-PAK) 0.3 mg/0.3 mL IJ SOAJ injection  As needed     05/01/19 1054           Wyvonnia Dusky, MD 05/01/19 1457

## 2019-05-01 NOTE — ED Notes (Signed)
Pt reports feeling much better, needs to leave to catch flight to [place], requesting discharge.  EDP Trifan made aware.

## 2019-05-01 NOTE — ED Triage Notes (Addendum)
Pt is concerned for an allergic reaction. Pt's skin is red and pt is itching. Pt speaking in full sentences in triage. Denies any new lotions/detergents/soaps. Pt states she woke up like this .

## 2019-11-11 ENCOUNTER — Other Ambulatory Visit: Payer: Self-pay

## 2019-11-11 ENCOUNTER — Encounter (HOSPITAL_COMMUNITY): Payer: Self-pay | Admitting: Emergency Medicine

## 2019-11-11 ENCOUNTER — Emergency Department (HOSPITAL_COMMUNITY)
Admission: EM | Admit: 2019-11-11 | Discharge: 2019-11-11 | Disposition: A | Discharge status: Home / Self Care | Attending: Emergency Medicine | Admitting: Emergency Medicine

## 2019-11-11 DIAGNOSIS — Z5321 Procedure and treatment not carried out due to patient leaving prior to being seen by health care provider: Secondary | ICD-10-CM | POA: Insufficient documentation

## 2019-11-11 DIAGNOSIS — R519 Headache, unspecified: Secondary | ICD-10-CM | POA: Insufficient documentation

## 2019-11-11 NOTE — ED Notes (Signed)
No answer

## 2019-11-11 NOTE — ED Triage Notes (Signed)
Pt reports that she just got out of the [facility] 2 weeks ago and needs to see someone about her Lp shunt. Reports having headaches.

## 2019-11-11 NOTE — ED Notes (Signed)
No answer from lobby  

## 2019-11-11 NOTE — ED Notes (Signed)
I called patient to recheck vitals and no one responded 

## 2019-11-12 ENCOUNTER — Other Ambulatory Visit: Payer: Self-pay

## 2019-11-12 ENCOUNTER — Emergency Department (HOSPITAL_COMMUNITY)

## 2019-11-12 ENCOUNTER — Encounter (HOSPITAL_COMMUNITY): Payer: Self-pay

## 2019-11-12 ENCOUNTER — Emergency Department (HOSPITAL_COMMUNITY)
Admission: EM | Admit: 2019-11-12 | Discharge: 2019-11-12 | Disposition: A | Discharge status: Home / Self Care | Attending: Emergency Medicine | Admitting: Emergency Medicine

## 2019-11-12 DIAGNOSIS — T8509XA Other mechanical complication of ventricular intracranial (communicating) shunt, initial encounter: Secondary | ICD-10-CM | POA: Insufficient documentation

## 2019-11-12 DIAGNOSIS — R42 Dizziness and giddiness: Secondary | ICD-10-CM | POA: Insufficient documentation

## 2019-11-12 DIAGNOSIS — R519 Headache, unspecified: Secondary | ICD-10-CM

## 2019-11-12 DIAGNOSIS — T85618A Breakdown (mechanical) of other specified internal prosthetic devices, implants and grafts, initial encounter: Secondary | ICD-10-CM

## 2019-11-12 HISTORY — DX: Benign intracranial hypertension: G93.2

## 2019-11-12 HISTORY — DX: Neoplasm of unspecified behavior of endocrine glands and other parts of nervous system: D49.7

## 2019-11-12 LAB — BASIC METABOLIC PANEL
Anion gap: 9 (ref 5–15)
BUN: 7 mg/dL (ref 6–20)
CO2: 26 mmol/L (ref 22–32)
Calcium: 9.7 mg/dL (ref 8.9–10.3)
Chloride: 105 mmol/L (ref 98–111)
Creatinine, Ser: 0.59 mg/dL (ref 0.44–1.00)
GFR calc Af Amer: >60 mL/min (ref >60)
GFR calc non Af Amer: >60 mL/min (ref >60)
Glucose, Bld: 90 mg/dL (ref 70–99)
Potassium: 4.1 mmol/L (ref 3.5–5.1)
Sodium: 140 mmol/L (ref 135–145)

## 2019-11-12 LAB — CBC
HCT: 48.5 % — ABNORMAL HIGH (ref 36.0–46.0)
Hemoglobin: 16 g/dL — ABNORMAL HIGH (ref 12.0–15.0)
MCH: 29.4 pg (ref 26.0–34.0)
MCHC: 33 g/dL (ref 30.0–36.0)
MCV: 89 fL (ref 80.0–100.0)
Platelets: 312 10*3/uL (ref 150–400)
RBC: 5.45 MIL/uL — ABNORMAL HIGH (ref 3.87–5.11)
RDW: 12.7 % (ref 11.5–15.5)
WBC: 5.9 10*3/uL (ref 4.0–10.5)
nRBC: 0 % (ref 0.0–0.2)

## 2019-11-12 LAB — I-STAT BETA HCG BLOOD, ED (MC, WL, AP ONLY): I-stat hCG, quantitative: <5 m[IU]/mL (ref <5)

## 2019-11-12 NOTE — ED Provider Notes (Signed)
[facility] DEPT Provider Note   CSN: [id number] Arrival date & time: 11/12/19  4098     History Chief Complaint  Patient presents with  . Headache  . Dizziness    Paula Haley is a 19 y.o. female.  HPI Patient presents with headache dizziness and unsteadiness.  Some nausea.  States she has pseudotumor cerebri.  Has a lumbar peritoneal shunt.  States it is malfunctioning and.  States it is malfunction for.  Had a placed in [place] back in February.  States she feels as if it has been having some issues for around a month now.  Recently left the [employer] and moved to [place].  States she is going to urgent care to be able to try and get to a Chief of Staff but has been unable to get into see 1.  Denies pregnancy.  Denies fever chills.  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  Past Medical History:  Diagnosis Date  . Pituitary tumor   . Pseudotumor cerebri     Patient Active Problem List   Diagnosis Date Noted  . SIRS (systemic inflammatory response syndrome) ([facility]) 02/01/2019  . Allergic reaction 02/01/2019  . 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 VENTRICULOPERITONEAL SHUNT  2020     OB History   No obstetric history on file.     Family History  Problem Relation Age of Onset  . Healthy Mother   . Healthy Father     Social History   Tobacco Use  . Smoking status: Never Smoker  . Smokeless tobacco: Never Used  Vaping Use  . Vaping Use: Never used  Substance Use Topics  . Alcohol use: Never  . Drug use: Yes    Types: Marijuana    Home Medications Prior to Admission medications   Medication Sig Start Date End Date Taking? Authorizing Provider  EPINEPHrine (EPIPEN 2-PAK) 0.3 mg/0.3 mL IJ SOAJ injection Inject 0.3 mLs (0.3 mg total) into the muscle as needed for up to 2 doses for anaphylaxis. 05/01/19  Yes Wyvonnia Dusky, MD    Allergies    Naproxen and  Penicillins  Review of Systems   Review of Systems  Constitutional: Negative for appetite change.  HENT: Negative for congestion.   Respiratory: Negative for shortness of breath.   Cardiovascular: Negative for chest pain.  Gastrointestinal: Positive for nausea.  Genitourinary: Negative for flank pain.  Musculoskeletal: Negative for back pain.  Neurological: Positive for dizziness and headaches.  Psychiatric/Behavioral: Negative for confusion.    Physical Exam Updated Vital Signs BP 114/90 (BP Location: Left Arm)   Pulse 86   Temp 97.6 F (36.4 C) (Oral)   Resp 18   Ht 5\' 7"  (1.702 m)   Wt 65.8 kg   LMP 11/05/2019 (Approximate)   SpO2 100%   BMI 22.71 kg/m   Physical Exam Vitals reviewed.  HENT:     Head: Atraumatic.  Eyes:     Pupils: Pupils are equal, round, and reactive to light.  Cardiovascular:     Rate and Rhythm: Regular rhythm.  Pulmonary:     Breath sounds: Normal breath sounds.  Abdominal:     General: There is no distension.  Musculoskeletal:     Comments: Scar on lumbar area from surgery.  Skin:    General: Skin is warm.  Neurological:     Mental Status: She is alert.  Psychiatric:        Behavior:  Behavior normal.     ED Results / Procedures / Treatments   Labs (all labs ordered are listed, but only abnormal results are displayed) Labs Reviewed  BASIC METABOLIC PANEL  CBC  I-STAT BETA HCG BLOOD, ED ([facility], [facility], [facility] ONLY)    EKG None  Radiology CT Head Wo Contrast  Result Date: 11/12/2019 CLINICAL DATA:  Headache, papilledema.  History of a prior shunt. EXAM: CT HEAD WITHOUT CONTRAST TECHNIQUE: Contiguous axial images were obtained from the base of the skull through the vertex without intravenous contrast. COMPARISON:  CT head 02/01/2019 FINDINGS: Brain: No evidence of acute infarction, hemorrhage, hydrocephalus, extra-axial collection or mass lesion/mass effect. Vascular: No hyperdense vessel or unexpected calcification. Skull: Normal. Negative  for fracture or focal lesion. Sinuses/Orbits: No acute finding. Other: None. IMPRESSION: No acute intracranial process. Electronically Signed   By: Audie Pinto M.D.   On: 11/12/2019 14:15    Procedures Procedures (including critical care time)  Medications Ordered in ED Medications - No data to display  ED Course  I have reviewed the triage vital signs and the nursing notes.  Pertinent labs & imaging results that were available during my care of the patient were reviewed by me and considered in my medical decision making (see chart for details).    MDM Rules/Calculators/A&P                          Patient with known vertebral peritoneal shunt.  Reportedly had become septic off and had to be replaced.  States she feels as if her pressures are high.  Head CT and x-rays reassuring.  Initially discussed with neurosurgery who will be reviewing the imaging.  Likely outpatient follow-up at discharge.  Care turned over to Dr. Sherry Ruffing. Final Clinical Impression(s) / ED Diagnoses Final diagnoses:  Shunt malfunction, initial encounter    Rx / DC Orders ED Discharge Orders    None       Davonna Belling, MD 11/12/19 1600

## 2019-11-12 NOTE — Discharge Instructions (Signed)
Your work-up today did not show convincing evidence of complete shunt malfunction. The neurosurgery team reviewed all the images and felt safe with you going home to follow-up with our clinic and get established. Please rest and stay hydrated and follow-up with the neurosurgery team. If any symptoms change or worsen, please return to the nearest emergency department immediately.

## 2019-11-12 NOTE — ED Provider Notes (Signed)
4:07 PM Care assumed from Dr. Alvino Chapel.  At time of transfer of care, we are waiting neurosurgery to call back after the patient had imaging.  Plan of care will be to hopefully discharge with neurosurgery follow-up.  4:38 PM Spoke with Dr. Kathyrn Sheriff who reviewed the images. He feels the patient is safe for discharge home if she is feeling better. He will see her in clinic for follow-up and get establishment for further care. Patient was reassessed by me and was still feeling well. She agrees with discharge. She was given information for follow-up and return precautions. Patient discharged in good condition.   Clinical Impression: 1. Bad headache   2. Shunt malfunction, initial encounter     Disposition: Discharge  Condition: Good  I have discussed the results, Dx and Tx plan with the pt(& family if present). He/she/they expressed understanding and agree(s) with the plan. Discharge instructions discussed at great length. Strict return precautions discussed and pt &/or family have verbalized understanding of the instructions. No further questions at time of discharge.    New Prescriptions   No medications on file    Follow Up: Consuella Lose, MD [address] [place] [address] [place] [address] [phone]  Call    [facility] DEPT [address] [id number] mc [address] [place]       Romir Klimowicz, Gwenyth Allegra, MD 11/12/19 [id number]

## 2019-11-12 NOTE — ED Triage Notes (Addendum)
Patient states she is from [place] and has a Lumbar shunt that is malfunctioning. Patient states that she has already had one replaced. Patient states she was sent here for scans and a referral to a neuro surgeon.  Patient is currently c/o headache and dizziness.

## 2019-11-25 ENCOUNTER — Encounter: Payer: Self-pay | Admitting: *Deleted

## 2019-11-30 ENCOUNTER — Ambulatory Visit: Admitting: Diagnostic Neuroimaging

## 2020-01-21 ENCOUNTER — Other Ambulatory Visit: Payer: Self-pay

## 2020-01-22 ENCOUNTER — Encounter: Payer: Self-pay | Admitting: Diagnostic Neuroimaging

## 2020-01-22 ENCOUNTER — Telehealth: Payer: Self-pay | Admitting: *Deleted

## 2020-01-22 ENCOUNTER — Ambulatory Visit: Admitting: Diagnostic Neuroimaging

## 2020-01-22 NOTE — Telephone Encounter (Signed)
Pt no showed new patient appointment today. She also canceled 11/30/19 new patient appointment 3 hours prior to the appointment due to scheduling conflict.

## 2020-01-26 ENCOUNTER — Encounter (HOSPITAL_COMMUNITY): Payer: Self-pay | Admitting: Emergency Medicine

## 2020-01-26 ENCOUNTER — Encounter: Payer: Self-pay | Admitting: Diagnostic Neuroimaging

## 2020-01-26 ENCOUNTER — Other Ambulatory Visit: Payer: Self-pay

## 2020-01-26 ENCOUNTER — Emergency Department (HOSPITAL_COMMUNITY)
Admission: EM | Admit: 2020-01-26 | Discharge: 2020-01-27 | Disposition: A | Discharge status: Home / Self Care | Attending: Emergency Medicine | Admitting: Emergency Medicine

## 2020-01-26 DIAGNOSIS — X58XXXA Exposure to other specified factors, initial encounter: Secondary | ICD-10-CM | POA: Diagnosis not present

## 2020-01-26 DIAGNOSIS — R197 Diarrhea, unspecified: Secondary | ICD-10-CM | POA: Diagnosis present

## 2020-01-26 DIAGNOSIS — S39012A Strain of muscle, fascia and tendon of lower back, initial encounter: Secondary | ICD-10-CM

## 2020-01-26 DIAGNOSIS — S29012A Strain of muscle and tendon of back wall of thorax, initial encounter: Secondary | ICD-10-CM | POA: Insufficient documentation

## 2020-01-26 DIAGNOSIS — Z87891 Personal history of nicotine dependence: Secondary | ICD-10-CM | POA: Insufficient documentation

## 2020-01-26 MED ORDER — ACETAMINOPHEN 500 MG PO TABS
1000.0000 mg | ORAL_TABLET | Freq: Once | ORAL | Status: AC
  Administered 2020-01-26: 1000 mg via ORAL
  Filled 2020-01-26: qty 2

## 2020-01-26 MED ORDER — ONDANSETRON HCL 4 MG/2ML IJ SOLN
4.0000 mg | Freq: Once | INTRAMUSCULAR | Status: AC
  Administered 2020-01-26: 4 mg via INTRAVENOUS
  Filled 2020-01-26: qty 2

## 2020-01-26 MED ORDER — SODIUM CHLORIDE 0.9 % IV BOLUS
1000.0000 mL | Freq: Once | INTRAVENOUS | Status: AC
  Administered 2020-01-26: 1000 mL via INTRAVENOUS

## 2020-01-26 NOTE — ED Triage Notes (Signed)
Pt c/o back pain, HA, body aches, and N/VD that began earlier today. Pt unable to keep fluids/food down. Pt has hx of LP shunt in back and states that is where her pain is. Pt states she [facility] the [facility] earlier and they recommended her come here.

## 2020-01-26 NOTE — ED Provider Notes (Signed)
[facility] DEPT Provider Note  CSN: [id number] Arrival date & time: 01/26/20 2151  Chief Complaint(s) Back Pain, Emesis, and Diarrhea  HPI Paula Haley is a 19 y.o. female with a past medical history IIA H status post VP shunt here for 1 day of watery diarrhea, nausea and few episodes of nonbloody nonbilious emesis earlier in the day.  Emesis have resolved and patient has been able to keep fluids down however every time she takes something orally she has to have a bowel movement.  Patient denies any abdominal pain.  She does endorse right lower and upper back pain that began several hours after her GI symptoms.  She endorses temperature 99, no overt fevers.  No chills.  No urinary symptoms.  Possible suspicious food intake the night before but she does not believe that is the cause of her symptoms.  She does endorse stable chronic mild headache.  No other physical complaints.  HPI  Past Medical History Past Medical History:  Diagnosis Date  . Dizziness   . Headache   . Pituitary tumor   . Pseudotumor cerebri    Patient Active Problem List   Diagnosis Date Noted  . SIRS (systemic inflammatory response syndrome) ([facility]) 02/01/2019  . Allergic reaction 02/01/2019  . 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 Authorizing Provider  EPINEPHrine (EPIPEN 2-PAK) 0.3 mg/0.3 mL IJ SOAJ injection Inject 0.3 mLs (0.3 mg total) into the muscle as needed for up to 2 doses for anaphylaxis. 05/01/19   Wyvonnia Dusky, MD  ondansetron (ZOFRAN ODT) 4 MG disintegrating tablet Take 1 tablet (4 mg total) by mouth every 8 (eight) hours as needed for up to 3 days for nausea or vomiting. 01/27/20 01/30/20  Nikolos Billig, Grayce Sessions, MD                                                                                                                                    Past Surgical History Past  Surgical History:  Procedure Laterality Date  . VENTRICULOPERITONEAL SHUNT  2020   Family History Family History  Problem Relation Age of Onset  . Healthy Mother   . Healthy Father     Social History Social History   Tobacco Use  . Smoking status: Former Smoker    Types: Cigarettes  . Smokeless tobacco: Never Used  . Tobacco comment: Occasional cigarette smoker  Vaping Use  . Vaping Use: Never used  Substance Use Topics  . Alcohol use: Never  . Drug use: Yes    Types: Marijuana   Allergies Naproxen and Penicillins  Review of Systems Review of Systems All other systems are reviewed and are negative for acute change except as noted in the HPI  Physical Exam Vital Signs  I have reviewed the triage vital signs BP 101/68   Pulse (!) 114  Temp 98.2 F (36.8 C) (Oral)   Resp 17   Ht 5\' 7"  (1.702 m)   Wt 66.7 kg   SpO2 98%   BMI 23.02 kg/m   Physical Exam Vitals reviewed.  Constitutional:      General: She is not in acute distress.    Appearance: She is well-developed. She is not diaphoretic.  HENT:     Head: Normocephalic and atraumatic.     Nose: Nose normal.  Eyes:     General: No scleral icterus.       Right eye: No discharge.        Left eye: No discharge.     Conjunctiva/sclera: Conjunctivae normal.     Pupils: Pupils are equal, round, and reactive to light.  Cardiovascular:     Rate and Rhythm: Normal rate and regular rhythm.     Heart sounds: No murmur heard.  No friction rub. No gallop.   Pulmonary:     Effort: Pulmonary effort is normal. No respiratory distress.     Breath sounds: Normal breath sounds. No stridor. No rales.  Abdominal:     General: There is no distension.     Palpations: Abdomen is soft.     Tenderness: There is no abdominal tenderness.  Musculoskeletal:     Cervical back: Normal range of motion and neck supple.     Thoracic back: Spasms and tenderness present.     Lumbar back: Spasms and tenderness present.        Back:  Skin:    General: Skin is warm and dry.     Findings: No erythema or rash.  Neurological:     Mental Status: She is alert and oriented to person, place, and time.     ED Results and Treatments Labs (all labs ordered are listed, but only abnormal results are displayed) Labs Reviewed  CBC WITH DIFFERENTIAL/PLATELET - Abnormal; Notable for the following components:      Result Value   Lymphs Abs 0.5 (*)    Abs Immature Granulocytes 0.10 (*)    All other components within normal limits  COMPREHENSIVE METABOLIC PANEL - Abnormal; Notable for the following components:   Potassium 3.3 (*)    CO2 21 (*)    Total Bilirubin 1.4 (*)    All other components within normal limits  LIPASE, BLOOD  URINALYSIS, ROUTINE W REFLEX MICROSCOPIC                                                                                                                         EKG  EKG Interpretation  Date/Time:    Ventricular Rate:    PR Interval:    QRS Duration:   QT Interval:    QTC Calculation:   R Axis:     Text Interpretation:        Radiology No results found.  Pertinent labs & imaging results that were available during my care of the patient were reviewed by me and considered in  my medical decision making (see chart for details).  Medications Ordered in ED Medications  sodium chloride 0.9 % bolus 1,000 mL (0 mLs Intravenous Stopped 01/27/20 0211)  ondansetron (ZOFRAN) injection 4 mg (4 mg Intravenous Given 01/26/20 2335)  acetaminophen (TYLENOL) tablet 1,000 mg (1,000 mg Oral Given 01/26/20 2335)                                                                                                                                    Procedures Procedures  (including critical care time)  Medical Decision Making / ED Course I have reviewed the nursing notes for this encounter and the patient's prior records (if available in EHR or on provided paperwork).   Paula Haley was evaluated in  Emergency Department on 01/27/2020 for the symptoms described in the history of present illness. She was evaluated in the context of the global COVID-19 pandemic, which necessitated consideration that the patient might be at risk for infection with the SARS-CoV-2 virus that causes COVID-19. Institutional protocols and algorithms that pertain to the evaluation of patients at risk for COVID-19 are in a state of rapid change based on information released by regulatory bodies including the CDC and federal and state organizations. These policies and algorithms were followed during the patient's care in the ED.  19 y.o. female presents with vomiting, diarrhea, and back pain for 1 day. Possible suspicious food intake.  Adequate oral tolerance. Rest of history as above.  Patient appears well, not in distress, and with no signs of toxicity or dehydration. Abdomen benign.  Rest of the exam as above.  Labs grossly reassuring without leukocytosis or anemia.  No significant electrolyte derangements or renal sufficiency.  UA without evidence of infection.  Patient was provided with IV fluids and Zofran.  Most consistent with viral gastroenteritis vs food poisoning.   Doubt appendicitis, diverticulitis, severe colitis, dysentery.    Able to tolerate oral intake in the ED.  Back appears to be muscular in nature. Doubt shunt infection.   Discussed symptomatic treatment with the patient and they will follow closely with their PCP.       Final Clinical Impression(s) / ED Diagnoses Final diagnoses:  Diarrhea of presumed infectious origin  Back strain, initial encounter   The patient appears reasonably screened and/or stabilized for discharge and I doubt any other medical condition or other [facility] requiring further screening, evaluation, or treatment in the ED at this time prior to discharge. Safe for discharge with strict return precautions.  Disposition: Discharge  Condition: Good  I have discussed the  results, Dx and Tx plan with the patient/family who expressed understanding and agree(s) with the plan. Discharge instructions discussed at length. The patient/family was given strict return precautions who verbalized understanding of the instructions. No further questions at time of discharge.    ED Discharge Orders         Ordered    ondansetron (ZOFRAN ODT)  4 MG disintegrating tablet  Every 8 hours PRN        01/27/20 0244           Follow Up: Primary care provider  Schedule an appointment as soon as possible for a visit  If you do not have a primary care physician, contact [facility] at [phone] for referral      This chart was dictated using voice recognition software.  Despite best efforts to proofread,  errors can occur which can change the documentation meaning.   Fatima Blank, MD 01/27/20 [phone]

## 2020-01-26 NOTE — ED Notes (Signed)
Patient provided with water at bedside.

## 2020-01-27 LAB — COMPREHENSIVE METABOLIC PANEL
ALT: 12 U/L (ref 0–44)
AST: 19 U/L (ref 15–41)
Albumin: 4 g/dL (ref 3.5–5.0)
Alkaline Phosphatase: 50 U/L (ref 38–126)
Anion gap: 10 (ref 5–15)
BUN: 12 mg/dL (ref 6–20)
CO2: 21 mmol/L — ABNORMAL LOW (ref 22–32)
Calcium: 9 mg/dL (ref 8.9–10.3)
Chloride: 105 mmol/L (ref 98–111)
Creatinine, Ser: 0.57 mg/dL (ref 0.44–1.00)
GFR calc Af Amer: >60 mL/min (ref >60)
GFR calc non Af Amer: >60 mL/min (ref >60)
Glucose, Bld: 98 mg/dL (ref 70–99)
Potassium: 3.3 mmol/L — ABNORMAL LOW (ref 3.5–5.1)
Sodium: 136 mmol/L (ref 135–145)
Total Bilirubin: 1.4 mg/dL — ABNORMAL HIGH (ref 0.3–1.2)
Total Protein: 7 g/dL (ref 6.5–8.1)

## 2020-01-27 LAB — CBC WITH DIFFERENTIAL/PLATELET
Abs Immature Granulocytes: 0.1 10*3/uL — ABNORMAL HIGH (ref 0.00–0.07)
Basophils Absolute: 0 10*3/uL (ref 0.0–0.1)
Basophils Relative: 0 %
Eosinophils Absolute: 0 10*3/uL (ref 0.0–0.5)
Eosinophils Relative: 0 %
HCT: 43.6 % (ref 36.0–46.0)
Hemoglobin: 14.6 g/dL (ref 12.0–15.0)
Immature Granulocytes: 1 %
Lymphocytes Relative: 6 %
Lymphs Abs: 0.5 10*3/uL — ABNORMAL LOW (ref 0.7–4.0)
MCH: 29.3 pg (ref 26.0–34.0)
MCHC: 33.5 g/dL (ref 30.0–36.0)
MCV: 87.4 fL (ref 80.0–100.0)
Monocytes Absolute: 0.6 10*3/uL (ref 0.1–1.0)
Monocytes Relative: 8 %
Neutro Abs: 6.6 10*3/uL (ref 1.7–7.7)
Neutrophils Relative %: 85 %
Platelets: 241 10*3/uL (ref 150–400)
RBC: 4.99 MIL/uL (ref 3.87–5.11)
RDW: 12.9 % (ref 11.5–15.5)
WBC: 7.8 10*3/uL (ref 4.0–10.5)
nRBC: 0 % (ref 0.0–0.2)

## 2020-01-27 LAB — URINALYSIS, ROUTINE W REFLEX MICROSCOPIC
Bilirubin Urine: NEGATIVE
Glucose, UA: NEGATIVE mg/dL
Hgb urine dipstick: NEGATIVE
Ketones, ur: NEGATIVE mg/dL
Leukocytes,Ua: NEGATIVE
Nitrite: NEGATIVE
Protein, ur: NEGATIVE mg/dL
Specific Gravity, Urine: 1.03 (ref 1.005–1.030)
pH: 5 (ref 5.0–8.0)

## 2020-01-27 LAB — LIPASE, BLOOD: Lipase: 23 U/L (ref 11–51)

## 2020-01-27 MED ORDER — ONDANSETRON 4 MG PO TBDP
4.0000 mg | ORAL_TABLET | Freq: Three times a day (TID) | ORAL | 0 refills | Status: AC | PRN
Start: 2020-01-27 — End: 2020-01-30

## 2020-05-09 ENCOUNTER — Emergency Department (HOSPITAL_COMMUNITY)
Admission: EM | Admit: 2020-05-09 | Discharge: 2020-05-09 | Disposition: A | Discharge status: Home / Self Care | Payer: No Typology Code available for payment source | Attending: Emergency Medicine | Admitting: Emergency Medicine

## 2020-05-09 ENCOUNTER — Other Ambulatory Visit: Payer: Self-pay

## 2020-05-09 ENCOUNTER — Emergency Department (HOSPITAL_COMMUNITY): Payer: No Typology Code available for payment source

## 2020-05-09 ENCOUNTER — Encounter (HOSPITAL_COMMUNITY): Payer: Self-pay

## 2020-05-09 DIAGNOSIS — Z87891 Personal history of nicotine dependence: Secondary | ICD-10-CM | POA: Insufficient documentation

## 2020-05-09 DIAGNOSIS — Z8616 Personal history of COVID-19: Secondary | ICD-10-CM

## 2020-05-09 DIAGNOSIS — E878 Other disorders of electrolyte and fluid balance, not elsewhere classified: Secondary | ICD-10-CM | POA: Diagnosis not present

## 2020-05-09 DIAGNOSIS — M545 Low back pain, unspecified: Secondary | ICD-10-CM | POA: Diagnosis not present

## 2020-05-09 DIAGNOSIS — U071 COVID-19: Secondary | ICD-10-CM | POA: Diagnosis not present

## 2020-05-09 DIAGNOSIS — R519 Headache, unspecified: Secondary | ICD-10-CM

## 2020-05-09 HISTORY — DX: Personal history of COVID-19: Z86.16

## 2020-05-09 LAB — I-STAT BETA HCG BLOOD, ED (MC, WL, AP ONLY)
I-stat hCG, quantitative: <5 m[IU]/mL (ref <5)
I-stat hCG, quantitative: <5 m[IU]/mL (ref <5)

## 2020-05-09 LAB — BASIC METABOLIC PANEL
Anion gap: 12 (ref 5–15)
BUN: 6 mg/dL (ref 6–20)
CO2: 19 mmol/L — ABNORMAL LOW (ref 22–32)
Calcium: 9.5 mg/dL (ref 8.9–10.3)
Chloride: 104 mmol/L (ref 98–111)
Creatinine, Ser: 1.01 mg/dL — ABNORMAL HIGH (ref 0.44–1.00)
GFR, Estimated: >60 mL/min (ref >60)
Glucose, Bld: 76 mg/dL (ref 70–99)
Potassium: 5.7 mmol/L — ABNORMAL HIGH (ref 3.5–5.1)
Sodium: 135 mmol/L (ref 135–145)

## 2020-05-09 LAB — CSF CELL COUNT WITH DIFFERENTIAL
RBC Count, CSF: 1 /mm3 — ABNORMAL HIGH
Tube #: 4
WBC, CSF: 2 /mm3 (ref 0–5)

## 2020-05-09 LAB — I-STAT CHEM 8, ED
BUN: 4 mg/dL — ABNORMAL LOW (ref 6–20)
Calcium, Ion: 1.2 mmol/L (ref 1.15–1.40)
Chloride: 105 mmol/L (ref 98–111)
Creatinine, Ser: 0.5 mg/dL (ref 0.44–1.00)
Glucose, Bld: 75 mg/dL (ref 70–99)
HCT: 40 % (ref 36.0–46.0)
Hemoglobin: 13.6 g/dL (ref 12.0–15.0)
Potassium: 3.8 mmol/L (ref 3.5–5.1)
Sodium: 140 mmol/L (ref 135–145)
TCO2: 24 mmol/L (ref 22–32)

## 2020-05-09 LAB — CBC WITH DIFFERENTIAL/PLATELET
Abs Immature Granulocytes: 0.03 10*3/uL (ref 0.00–0.07)
Basophils Absolute: 0 10*3/uL (ref 0.0–0.1)
Basophils Relative: 1 %
Eosinophils Absolute: 0 10*3/uL (ref 0.0–0.5)
Eosinophils Relative: 1 %
HCT: 46.8 % — ABNORMAL HIGH (ref 36.0–46.0)
Hemoglobin: 15.4 g/dL — ABNORMAL HIGH (ref 12.0–15.0)
Immature Granulocytes: 1 %
Lymphocytes Relative: 4 %
Lymphs Abs: 0.2 10*3/uL — ABNORMAL LOW (ref 0.7–4.0)
MCH: 29.3 pg (ref 26.0–34.0)
MCHC: 32.9 g/dL (ref 30.0–36.0)
MCV: 89.1 fL (ref 80.0–100.0)
Monocytes Absolute: 0.5 10*3/uL (ref 0.1–1.0)
Monocytes Relative: 9 %
Neutro Abs: 5.2 10*3/uL (ref 1.7–7.7)
Neutrophils Relative %: 84 %
Platelets: 263 10*3/uL (ref 150–400)
RBC: 5.25 MIL/uL — ABNORMAL HIGH (ref 3.87–5.11)
RDW: 13.1 % (ref 11.5–15.5)
WBC: 6.1 10*3/uL (ref 4.0–10.5)
nRBC: 0 % (ref 0.0–0.2)

## 2020-05-09 LAB — RESP PANEL BY RT-PCR (FLU A&B, COVID) ARPGX2
Influenza A by PCR: NEGATIVE
Influenza B by PCR: NEGATIVE
SARS Coronavirus 2 by RT PCR: POSITIVE — AB

## 2020-05-09 LAB — PROTEIN AND GLUCOSE, CSF
Glucose, CSF: 57 mg/dL (ref 40–70)
Total  Protein, CSF: 17 mg/dL (ref 15–45)

## 2020-05-09 MED ORDER — METOCLOPRAMIDE HCL 5 MG/ML IJ SOLN
10.0000 mg | Freq: Once | INTRAMUSCULAR | Status: AC
  Administered 2020-05-09: 10 mg via INTRAVENOUS
  Filled 2020-05-09: qty 2

## 2020-05-09 MED ORDER — SODIUM CHLORIDE 0.9 % IV BOLUS
1000.0000 mL | Freq: Once | INTRAVENOUS | Status: AC
  Administered 2020-05-09: 1000 mL via INTRAVENOUS

## 2020-05-09 MED ORDER — LIDOCAINE HCL 1 % IJ SOLN
INTRAMUSCULAR | Status: AC
  Filled 2020-05-09: qty 20

## 2020-05-09 MED ORDER — FENTANYL CITRATE (PF) 100 MCG/2ML IJ SOLN
50.0000 ug | Freq: Once | INTRAMUSCULAR | Status: AC
  Administered 2020-05-09: 50 ug via INTRAVENOUS
  Filled 2020-05-09: qty 2

## 2020-05-09 MED ORDER — MORPHINE SULFATE (PF) 4 MG/ML IV SOLN
4.0000 mg | Freq: Once | INTRAVENOUS | Status: AC
  Administered 2020-05-09: 4 mg via INTRAVENOUS
  Filled 2020-05-09: qty 1

## 2020-05-09 NOTE — ED Triage Notes (Signed)
Pt presents with c/o headache and back pain. Pt reports she woke up with the pain this morning. Pt does have a VP shunt in place.

## 2020-05-09 NOTE — ED Provider Notes (Addendum)
[facility] DEPT Provider Note   CSN: [id number] Arrival date & time: 05/09/20  1305     History Chief Complaint  Patient presents with  . Back Pain  . Headache    Paula Haley is a 20 y.o. female with PMHx idiopathic ICH with LP shunt, presenting to the ED with complaint of low back pain and headache worsening today. Patient reports this morning her low back pain significantly worsened.  She has been having some discomfort since having her LP shunt replaced in February [id number] after complication soon after having it initially placed.  She states she has had daily headaches since the LP shunt placement as well.  Today her headache is more sharp in nature behind her eyes, worse than usual as well.  She has associated nausea and photophobia.  Her low back pain is worse with laying down and walking, it is improved when she sits up.  She has not treated her symptoms with any medications because she states nothing over-the-counter helps her symptoms.  She has no new numbness or weakness in her extremities.  She thinks she may be having some difficulty with her peripheral vision as she did in the past.  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  Does not recall injury.  No bowel or bladder incontinence, saddle paresthesia, or other associated injuries. States she is followed by the [place], is having much difficulty getting any outpatient follow-up.  She states the LP shunt is not improving her headache which is what it was intended to do, and requests it be removed.  Reports after initial placement of shunt she had infection and needed it replaced in Feb 2021. It was placed in [place], [place] while she was still in the [employer].  The history is provided by the patient.       Past Medical History:  Diagnosis Date  . Dizziness   . Headache   . Pituitary tumor   . Pseudotumor cerebri     Patient Active Problem List   Diagnosis Date  Noted  . SIRS (systemic inflammatory response syndrome) ([place]) 02/01/2019  . Allergic reaction 02/01/2019  . 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 VENTRICULOPERITONEAL SHUNT  2020     OB History   No obstetric history on file.     Family History  Problem Relation Age of Onset  . Healthy Mother   . Healthy Father     Social History   Tobacco Use  . Smoking status: Former Smoker    Types: Cigarettes  . Smokeless tobacco: Never Used  . Tobacco comment: Occasional cigarette smoker  Vaping Use  . Vaping Use: Never used  Substance Use Topics  . Alcohol use: Never  . Drug use: Yes    Types: Marijuana    Home Medications Prior to Admission medications   Medication Sig Start Date End Date Taking? Authorizing Provider  EPINEPHrine (EPIPEN 2-PAK) 0.3 mg/0.3 mL IJ SOAJ injection Inject 0.3 mLs (0.3 mg total) into the muscle as needed for up to 2 doses for anaphylaxis. 05/01/19  Yes Wyvonnia Dusky, MD  FLUoxetine (PROZAC) 10 MG capsule Take 10 mg by mouth daily. 04/19/20  Yes [provider]    Allergies    Naproxen and Penicillins  Review of Systems   Review of Systems  Constitutional: Negative for chills and fever.  Eyes: Positive for photophobia.  Gastrointestinal: Positive for  nausea.  Musculoskeletal: Positive for back pain.  Neurological: Positive for headaches.  All other systems reviewed and are negative.   Physical Exam Updated Vital Signs BP 125/86   Pulse 72   Temp 99.4 F (37.4 C) (Oral)   Resp 18   LMP 04/25/2020 (Approximate)   SpO2 100%   Physical Exam Vitals and nursing note reviewed.  Constitutional:      Appearance: She is well-developed and well-nourished. She is not ill-appearing.  HENT:     Head: Normocephalic and atraumatic.  Eyes:     Conjunctiva/sclera: Conjunctivae normal.  Cardiovascular:     Rate and Rhythm: Normal rate and regular rhythm.  Pulmonary:     Effort: Pulmonary  effort is normal.     Breath sounds: Normal breath sounds.  Abdominal:     General: Bowel sounds are normal.     Palpations: Abdomen is soft.  Musculoskeletal:     Comments: Generalized TTP to lumbar region. Midline surgical scar with palpable subcutaneous mass. Tender in this region as well. No overlying erythema, warmth or fluctuance. Positive straight leg raise on right  Skin:    General: Skin is warm.  Neurological:     Mental Status: She is alert.     Comments: Mental Status:  Alert, oriented, thought content appropriate, able to give a coherent history. Speech fluent without evidence of aphasia. Able to follow 2 step commands without difficulty.  Cranial Nerves:  II:  Peripheral visual fields grossly normal, pupils equal, round, reactive to light III,IV, VI: ptosis not present, extra-ocular motions intact bilaterally  V,VII: smile symmetric, facial light touch sensation equal VIII: hearing grossly normal to voice  X: uvula elevates symmetrically  XI: bilateral shoulder shrug symmetric and strong XII: midline tongue extension without fassiculations Motor:  Normal tone. 5/5 strength in upper and lower extremities bilaterally including strong and equal grip strength and dorsiflexion/plantar flexion Sensory: grossly normal in all extremities.  Cerebellar: normal finger-to-nose with bilateral upper extremities Gait: normal gait and balance CV: distal pulses palpable throughout    Psychiatric:        Mood and Affect: Mood and affect normal.        Behavior: Behavior normal.     ED Results / Procedures / Treatments   Labs (all labs ordered are listed, but only abnormal results are displayed) Labs Reviewed  RESP PANEL BY RT-PCR (FLU A&B, COVID) ARPGX2 - Abnormal; Notable for the following components:      Result Value   SARS Coronavirus 2 by RT PCR POSITIVE (*)    All other components within normal limits  BASIC METABOLIC PANEL - Abnormal; Notable for the following  components:   Potassium 5.7 (*)    CO2 19 (*)    Creatinine, Ser 1.01 (*)    All other components within normal limits  CBC WITH DIFFERENTIAL/PLATELET - Abnormal; Notable for the following components:   RBC 5.25 (*)    Hemoglobin 15.4 (*)    HCT 46.8 (*)    Lymphs Abs 0.2 (*)    All other components within normal limits  CSF CELL COUNT WITH DIFFERENTIAL - Abnormal; Notable for the following components:   RBC Count, CSF 1 (*)    All other components within normal limits  I-STAT CHEM 8, ED - Abnormal; Notable for the following components:   BUN 4 (*)    All other components within normal limits  CSF CULTURE  PROTEIN AND GLUCOSE, CSF  I-STAT BETA HCG BLOOD, ED ([facility], [facility], AP ONLY)  I-STAT BETA HCG BLOOD, ED ([facility], [facility], [facility] ONLY)    EKG None  Radiology CT Head Wo Contrast  Result Date: 05/09/2020 CLINICAL DATA:  Headache. Patient awoke this morning with a headache. Patient has a ventriculoperitoneal shunt. EXAM: CT HEAD WITHOUT CONTRAST TECHNIQUE: Contiguous axial images were obtained from the base of the skull through the vertex without intravenous contrast. COMPARISON:  Head CT, 11/12/2019. FINDINGS: Brain: No evidence of acute infarction, hemorrhage, hydrocephalus, extra-axial collection or mass lesion/mass effect. History reported a ventriculoperitoneal shunt. There is no shunt in place. There is no calvarial burr hole. Vascular: Normal. Skull: Normal. Negative for fracture or focal lesion. Sinuses/Orbits: Visualized globes and orbits are unremarkable. The visualized sinuses and mastoid air cells are clear. Other: None. IMPRESSION: Normal unenhanced CT scan of the brain. Electronically Signed   By: Amie Portland M.D.   On: 05/09/2020 14:42   CT Lumbar Spine Wo Contrast  Result Date: 05/09/2020 CLINICAL DATA:  Worsening low back pain EXAM: CT LUMBAR SPINE WITHOUT CONTRAST TECHNIQUE: Multidetector CT imaging of the lumbar spine was performed without intravenous contrast administration.  Multiplanar CT image reconstructions were also generated. COMPARISON:  Crushing made with abdominal radiograph 11/12/2019 FINDINGS: Segmentation: 5 lumbar type vertebrae. Alignment: Preserved. Vertebrae: Vertebral body heights are maintained. There is no sclerotic or destructive osseous lesion. Paraspinal and other soft tissues: A catheter enters the dorsal spinal canal between the T12 and L1 posterior elements and traverses the dorsal canal to the superior T10 level. Correlating with prior abdominal radiograph, this appears to represent a lumboperitoneal shunt with extra-spinal portion of catheter minimally included on the sagittal reformat. Disc levels: Intervertebral disc heights are maintained. There is no stenosis. IMPRESSION: Intact intraspinal portion of lumboperitoneal shunt catheter terminating dorsally at the superior T10 level. Epidural or subarachnoid location cannot be determined. No acute osseous abnormality. Electronically Signed   By: Guadlupe Spanish M.D.   On: 05/09/2020 14:53    Procedures .Critical Care Performed by: Saga Balthazar, [place] N, PA-C Authorized by: Sheridan Gettel, [place] N, PA-C   Critical care provider statement:    Critical care time (minutes):  45   Critical care was necessary to treat or prevent imminent or life-threatening deterioration of the following conditions:  CNS failure or compromise   Critical care was time spent personally by me on the following activities:  Discussions with consultants, evaluation of patient's response to treatment, examination of patient, ordering and performing treatments and interventions, ordering and review of laboratory studies, ordering and review of radiographic studies, pulse oximetry, re-evaluation of patient's condition, obtaining history from patient or surrogate and review of old charts   I assumed direction of critical care for this patient from another provider in my specialty: no   .Lumbar Puncture  Date/Time: 05/09/2020 9:52  PM Performed by: Dalylah Ramey, [place] N, PA-C Authorized by: Hina Gupta, [place] N, PA-C   Consent:    Consent obtained:  Verbal   Consent given by:  Patient   Risks, benefits, and alternatives were discussed: yes     Risks discussed:  Bleeding, headache and pain   Alternatives discussed:  No treatment Universal protocol:    Relevant documents present and verified: yes     Test results available: yes     Imaging studies available: yes     Required blood products, implants, devices, and special equipment available: yes     Immediately prior to procedure a time out was called: yes     Site/side marked: yes     Patient identity confirmed:  Verbally with patient Pre-procedure details:    Procedure purpose:  Diagnostic   Preparation: Patient was prepped and draped in usual sterile fashion   Sedation:    Sedation type:  None Anesthesia:    Anesthesia method:  Local infiltration   Local anesthetic:  Lidocaine 1% w/o epi Procedure details:    Lumbar space:  L4-L5 interspace   Patient position: initial attempt with patient in left lateral decub, successful LP with patient in right lateral decub.   Needle gauge:  22   Needle type: spinal needle.   Needle length (in):  3.5   Ultrasound guidance: no     Number of attempts:  4 (2 attempts by this writer, 2 by attending Dr. Roslynn Amble)   Opening pressure (cm H2O):  20   Fluid appearance:  Clear   Tubes of fluid:  4   Total volume (ml):  5 Post-procedure details:    Puncture site:  Adhesive bandage applied and direct pressure applied   Procedure completion:  Tolerated well, no immediate complications   (including critical care time)  Medications Ordered in ED Medications  metoCLOPramide (REGLAN) injection 10 mg (10 mg Intravenous Given 05/09/20 1457)  fentaNYL (SUBLIMAZE) injection 50 mcg (50 mcg Intravenous Given 05/09/20 1456)  sodium chloride 0.9 % bolus 1,000 mL (0 mLs Intravenous Stopped 05/09/20 1646)  morphine 4 MG/ML injection 4 mg (4 mg  Intravenous Given 05/09/20 1628)  morphine 4 MG/ML injection 4 mg (4 mg Intravenous Given 05/09/20 1814)  lidocaine (XYLOCAINE) 1 % (with pres) injection (  Given by Other 05/09/20 1853)    ED Course  I have reviewed the triage vital signs and the nursing notes.  Pertinent labs & imaging results that were available during my care of the patient were reviewed by me and considered in my medical decision making (see chart for details).  Clinical Course as of 05/09/20 2109  Tempie Donning Discussed with radiologist Dr. Armandina Gemma. CT wo contrast appropriate in the setting of low suspicion for infection [JR]  1535 CT imaging is reassuring. HA is much improved per patient. Back pain persists, will redose pain medication and re-evaluate. [JR]  72 Discussed with neurosurgeon Dr. Venetia Constable.  He recommends lumbar puncture to check for opening pressure considering peripheral vision change. [JR]  1904 LP performed at bedside with Dr. Roslynn Amble. Opening pressure 20. Fluid is clear [JR]  1905 Potassium: 3.8 improved [JR]    Clinical Course User Index [JR] Kashae Carstens, [place] N, PA-C   MDM Rules/Calculators/A&P                          Patient presenting to the ED with complaint of worsening chronic headache and low back pain after LP shunt placement in February 2021 for idiopathic intracranial hypertension.  She is not having any fevers at home though endorses nausea with some photophobia.  No particular back injury that she knows of.  On evaluation she appears uncomfortable though is not ill-appearing.  She has no focal neuro deficits.  Generalized tenderness to lumbar region, no focal tenderness to the spine, erythema, redness or warmth.  Initial vital signs were delayed in documentation however she is noted to have temperature of 100 F, stable vital signs.  Temperature recheck is 99.4 F, remains afebrile throughout ED stay.    Patient discussed with attending Dr. Sherry Ruffing.  Patient given headache  treatment and pain medication, IV fluids, head CT and CT of the L-spine is obtained.  There shows no evidence of hydrocephalus or complications with lumbar shunt.  Labs without leukocytosis though with mild electrolyte abnormalities suggestive of mild dehydration.  This will be rechecked with i-STAT Chem-8.  On reevaluation her pain seems much improved.  However considering near fever patient's complications in the past, will touch base with neurosurgery.  Overall low suspicion for meningitis.  LP performed per recommendation of Dr. Venetia Constable, considering vision changes. LP with opening pressure of 20, clear fluid. Cell counts are wnl.   COVID screening lab done considering borderline fever with backache and headache.  Covid test is positive, and likely attributing to worsening chronic headache and backache.  She has normal opening pressures, no evidence of meningitis or dysfunction to her shunt.  Patient feels much better after interventions.  She is appropriate for discharge with outpatient follow-up and Covid precautions.  She is provided with contact information to [facility], as it appears per Dr. Venetia Constable that patient may have been already referred to the clinic previously.  Patient was unaware of this.  She is instructed to call to schedule appointment after her quarantine is completed.  She instructed of return precautions, symptomatic management.  Patient is appropriate for discharge.  Discussed results, findings, treatment and follow up. Patient advised of return precautions. Patient verbalized understanding and agreed with plan.  Final Clinical Impression(s) / ED Diagnoses Final diagnoses:  COVID-19  Bad headache  Bilateral low back pain without sciatica, unspecified chronicity    Rx / DC Orders ED Discharge Orders    None       Velita Quirk, [place] N, PA-C 05/09/20 2159    Zanai Mallari, [place] N, PA-C 05/09/20 2200    Lucrezia Starch, MD 05/09/20 2342

## 2020-05-09 NOTE — Discharge Instructions (Addendum)
Please read instructions below.  You can take Tylenol/acetaminophen every 6 hours for sore throat, body aches, headache or fever.  Drink plenty of water.  Use saline nasal spray for congestion. Wash your hands frequently. Your COVID test is positive, isolate at home for at least 14 days after the day your symptoms initially began, and THEN at least 24 hours after you are fever-free without the help of medications AND your symptoms are improving.  Follow up with your primary care provider or the post-COVID care clinic at [facility] Return to the ER for significant shortness of breath, uncontrollable vomiting, severe chest pain, or other concerning symptoms. Schedule an appointment with neurosurgery for further management of your chronic headache and LP shunt.

## 2020-05-13 LAB — CSF CULTURE W GRAM STAIN
Culture: NO GROWTH
Gram Stain: NONE SEEN

## 2020-05-26 ENCOUNTER — Encounter: Payer: Self-pay | Admitting: Physician Assistant

## 2020-05-26 ENCOUNTER — Ambulatory Visit (INDEPENDENT_AMBULATORY_CARE_PROVIDER_SITE_OTHER): Payer: No Typology Code available for payment source

## 2020-05-26 ENCOUNTER — Ambulatory Visit (INDEPENDENT_AMBULATORY_CARE_PROVIDER_SITE_OTHER): Payer: No Typology Code available for payment source | Admitting: Physician Assistant

## 2020-05-26 DIAGNOSIS — M25562 Pain in left knee: Secondary | ICD-10-CM

## 2020-05-26 DIAGNOSIS — S83004A Unspecified dislocation of right patella, initial encounter: Secondary | ICD-10-CM

## 2020-05-26 DIAGNOSIS — S83105A Unspecified dislocation of left knee, initial encounter: Secondary | ICD-10-CM

## 2020-05-26 NOTE — Progress Notes (Addendum)
Office Visit Note   Patient: Paula Haley           Date of Birth: 04-25-2001           MRN: [id number] Visit Date: 05/26/2020              Requested by: Lindell Noe [address] [place],  [place] [address] PCP: Clinic, [place]: Visit Diagnoses:  1. Left knee pain, unspecified chronicity   2. Dislocation of left knee, initial encounter     Plan: She is placed in an open patella knee brace on the left with a lateral buttress.  Shown quad strengthening exercises that she can perform on both legs but particularly on the right.  We will obtain an MRI of her left knee to rule out a medial patellofemoral ligament injury.  Have her back once studies available.  Questions were encouraged and answered at length.  Did place her sedentary work only beginning next Monday until after follow-up at next office visit.  Follow-Up Instructions: Return After MRI.   Orders:  Orders Placed This Encounter  Procedures  . XR Knee 1-2 Views Left   No orders of the defined types were placed in this encounter.     Procedures: No procedures performed   Clinical Data: No additional findings.   Subjective: Chief Complaint  Patient presents with  . Left Knee - Pain    HPI Paula Haley is a 20 year old female comes in today with left knee pain.  She reports that on 05/24/2020 she slipped and fell on ice and dislocated her left knee she was seen at the [facility] and was advised that she dislocated her knee and that she needed to follow-up with orthopedics.  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  She feels the frequency of the lateral dislocations is increasing.  She states that she sustains 2 dislocations at least per month.  She feels her knee is going to give way.  She does have some pain in the right knee but no dislocations.  She has had no real  treatment for this.  She often is able to reduce the knee on her own.  Review of Systems See HPI otherwise negative  Objective: Vital Signs: There were no vitals taken for this visit.  Physical Exam Constitutional:      Appearance: She is not ill-appearing or diaphoretic.  Pulmonary:     Effort: Pulmonary effort is normal.  Neurological:     Mental Status: She is alert and oriented to person, place, and time.  Psychiatric:        Mood and Affect: Mood normal.     Ortho Exam Right knee full range of motion without pain.  There is some patellofemoral crepitus with passive range of motion of the right knee.  No instability valgus varus stressing.  Slight tenderness peripatellar region.  VMO atrophy noted.  Right knee negative Lamount Cranker and apprehension testing with lateralization of the patella. Left knee: No abnormal warmth erythema.  Slight effusion noted.  Unable to range leg in full extension.  Patella does track well passive range of motion with palpable crepitus.  Tenderness peripatellar region.  No attempts of subluxation.  Left knee examination is limited due to the patient's pain and inability to bring the leg to full extension.  Specialty Comments:  No specialty comments available.  Imaging: No results  found.   PMFS History: Patient Active Problem List   Diagnosis Date Noted  . SIRS (systemic inflammatory response syndrome) ([place]) 02/01/2019  . Allergic reaction 02/01/2019  . 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 Dizziness   . Headache   . Pituitary tumor   . Pseudotumor cerebri     Family History  Problem Relation Age of Onset  . Healthy Mother   . Healthy Father     Past Surgical History:  Procedure Laterality Date  . VENTRICULOPERITONEAL SHUNT  2020   Social History   Occupational History  . Not on file  Tobacco Use  . Smoking status: Former Smoker    Types: Cigarettes  . Smokeless tobacco: Never Used  . Tobacco  comment: Occasional cigarette smoker  Vaping Use  . Vaping Use: Never used  Substance and Sexual Activity  . Alcohol use: Never  . Drug use: Yes    Types: Marijuana  . Sexual activity: Yes    Birth control/protection: None    Comment: sexual intercourse with women only

## 2020-05-27 ENCOUNTER — Other Ambulatory Visit: Payer: Self-pay

## 2020-05-27 DIAGNOSIS — S83004A Unspecified dislocation of right patella, initial encounter: Secondary | ICD-10-CM

## 2020-05-27 DIAGNOSIS — M25562 Pain in left knee: Secondary | ICD-10-CM

## 2020-06-02 ENCOUNTER — Telehealth: Payer: Self-pay | Admitting: Physician Assistant

## 2020-06-02 NOTE — Telephone Encounter (Signed)
Received call from [place] with [place] needing the 05/26/2020 note faxed to her. The fax# is [phone]   The Ph# is [phone]

## 2020-06-02 NOTE — Telephone Encounter (Signed)
Faxed to provided number  

## 2020-06-05 ENCOUNTER — Ambulatory Visit (INDEPENDENT_AMBULATORY_CARE_PROVIDER_SITE_OTHER): Payer: No Typology Code available for payment source

## 2020-06-05 ENCOUNTER — Encounter (HOSPITAL_COMMUNITY): Payer: Self-pay

## 2020-06-05 ENCOUNTER — Ambulatory Visit (HOSPITAL_COMMUNITY)
Admission: EM | Admit: 2020-06-05 | Discharge: 2020-06-05 | Disposition: A | Discharge status: Home / Self Care | Payer: No Typology Code available for payment source | Attending: Emergency Medicine | Admitting: Emergency Medicine

## 2020-06-05 ENCOUNTER — Other Ambulatory Visit: Payer: Self-pay

## 2020-06-05 DIAGNOSIS — M25562 Pain in left knee: Secondary | ICD-10-CM
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
## 2020-06-05 NOTE — Discharge Instructions (Addendum)
Take ibuprofen as prescribed.  Rest and elevate your knee.  Apply ice packs 2-3 times a day for up to 20 minutes each.  Wear your knee brace as instructed by your orthopedist.   Follow up with an orthopedist on Monday.
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
## 2020-06-08 ENCOUNTER — Ambulatory Visit: Payer: No Typology Code available for payment source | Admitting: Physician Assistant

## 2020-06-19 ENCOUNTER — Other Ambulatory Visit: Payer: No Typology Code available for payment source

## 2020-06-22 ENCOUNTER — Encounter: Payer: Self-pay | Admitting: Physician Assistant

## 2020-06-22 ENCOUNTER — Ambulatory Visit (INDEPENDENT_AMBULATORY_CARE_PROVIDER_SITE_OTHER): Payer: No Typology Code available for payment source | Admitting: Physician Assistant

## 2020-06-22 DIAGNOSIS — M25562 Pain in left knee: Secondary | ICD-10-CM

## 2020-06-22 DIAGNOSIS — S83105A Unspecified dislocation of left knee, initial encounter: Secondary | ICD-10-CM

## 2020-06-22 NOTE — Progress Notes (Signed)
HPI: Paula Haley returns today to talk about her left knee.  She states she has had 3 more distalization since she was last seen.  She has not had her MRI due to the fact that she has scheduling problems with her work.  She states she wears the brace.  One time while wearing the open patella knee brace with a lateral buttress to the knee still dislocated.   Plan: Discussed with [name] that we would need to get the MRI so this needs to be careful in the interim and wear a knee brace.  She will follow up with [place] after the MRI.  Questions encouraged and answered.  No charge for today's office visit.

## 2020-06-28 ENCOUNTER — Other Ambulatory Visit: Payer: Self-pay

## 2020-06-28 ENCOUNTER — Ambulatory Visit
Admission: RE | Admit: 2020-06-28 | Discharge: 2020-06-28 | Disposition: A | Discharge status: Home / Self Care | Payer: No Typology Code available for payment source | Source: Ambulatory Visit | Attending: Physician Assistant | Admitting: Physician Assistant

## 2020-06-28 DIAGNOSIS — M25562 Pain in left knee: Secondary | ICD-10-CM

## 2020-06-28 DIAGNOSIS — S83004A Unspecified dislocation of right patella, initial encounter: Secondary | ICD-10-CM

## 2020-06-29 ENCOUNTER — Telehealth: Payer: Self-pay

## 2020-06-29 NOTE — Telephone Encounter (Signed)
IC LMVM for patient to call us back. Artis Delay talked with Dr Marlou Sa this afternoon and wishes for Dr Marlou Sa to see patient for her MRI review. Patient needs to be seen by Dr Marlou Sa next week.

## 2020-07-01 ENCOUNTER — Telehealth: Payer: Self-pay | Admitting: Orthopedic Surgery

## 2020-07-01 NOTE — Telephone Encounter (Signed)
Spoke with patient. Per previous note in chart rescheduled her to next week.

## 2020-07-01 NOTE — Telephone Encounter (Signed)
Patient called requesting to be put on cancellation list. Patient states her knee is getting worse and would like her MRI appt sooner. Patient phone number is [phone].

## 2020-07-04 ENCOUNTER — Ambulatory Visit: Payer: No Typology Code available for payment source | Admitting: Physician Assistant

## 2020-07-06 ENCOUNTER — Other Ambulatory Visit: Payer: No Typology Code available for payment source

## 2020-07-07 ENCOUNTER — Ambulatory Visit (INDEPENDENT_AMBULATORY_CARE_PROVIDER_SITE_OTHER): Payer: No Typology Code available for payment source | Admitting: Orthopedic Surgery

## 2020-07-07 ENCOUNTER — Encounter: Payer: Self-pay | Admitting: Orthopedic Surgery

## 2020-07-07 DIAGNOSIS — M25562 Pain in left knee: Secondary | ICD-10-CM | POA: Diagnosis not present

## 2020-07-07 DIAGNOSIS — S83512A Sprain of anterior cruciate ligament of left knee, initial encounter: Secondary | ICD-10-CM

## 2020-07-07 NOTE — Progress Notes (Signed)
Office Visit Note   Patient: Paula Haley           Date of Birth: 02-27-2001           MRN: [id number] Visit Date: 07/07/2020 Requested by: Lindell Noe [address] [place],  [place] [address] PCP: [facility]  Subjective: Chief Complaint  Patient presents with  . Left Knee - Pain    HPI: Paula Haley is a 20 y.o. female who presents to the office complaining of left knee pain and instability.  Patient states that 1-1/2 months ago she was walking and felt a pop in her left knee with excruciating pain and since then her knee has given out on her and she is unable to bear full weight on the left lower extremity.  She has history of being in the [employer] and was "told she needed surgery".  She has a history of patellar dislocation.  She is ambulating with crutches currently.  She has symptomatic instability throughout the day if she does not use crutches.  She is currently in school Copy.  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  She enjoys playing basketball in her free time as well as doing some strength training.  She does have a history of an LP shunt.  Uses a knee brace..                ROS: All systems reviewed are negative as they relate to the chief complaint within the history of present illness.  Patient denies fevers or chills.  Assessment & Plan: Visit Diagnoses:  1. Complete tear of anterior cruciate ligament of left knee, initial encounter   2. Left knee pain, unspecified chronicity     Plan: Patient is a 20 year old female who presents complaint of left knee pain.  She has had left knee symptomatic instability and increased pain with difficulty weightbearing on the left leg since feeling a pop while walking 1.5 months ago.  She has had MRI scan of the left knee that revealed complete anterior cruciate ligament disruption with suggestion of meniscal damage but no  definitive tear identified on the MRI scan.  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  She is not able to do that currently.  She would like to get back to being active as soon as possible.  Discussed ACL reconstruction as well as the graft harvest process and what that entails.  Discussed the recovery timeline.  After lengthy discussion, plan to proceed with surgical intervention with ACL reconstruction of the left knee with quadricep autograft.  We will assess menisci at time of surgery.  Patient given work note.  She will also be sent to physical therapy to work on range of motion prior to her procedure.  Follow-up after procedure.  Patient understands the risk and benefits which include but not limited to infection nerve vessel damage knee stiffness prolonged rehabilitation as well as potential need for more surgery.  Follow-Up Instructions: No follow-ups on file.   Orders:  Orders Placed This Encounter  Procedures  . Ambulatory referral to Physical Therapy   No orders of the defined types were placed in this encounter.     Procedures: No procedures performed   Clinical Data: No additional findings.  Objective: Vital Signs: There were no vitals taken for this visit.  Physical Exam:  Constitutional: Patient appears well-developed  HEENT:  Head: Normocephalic Eyes:EOM are normal Neck: Normal range of motion Cardiovascular: Normal rate Pulmonary/chest: Effort normal Neurologic: Patient is alert Skin: Skin is warm Psychiatric: Patient has normal mood and affect  Ortho Exam: Ortho exam demonstrates left knee with trace effusion.  Mild tenderness over the lateral joint line but no tenderness over the medial joint line.  Hard to extend past 15 degrees but over the course of the visit she did reach near full extension after relaxing.  Unable to flex to greater than 60 degrees.  No ligamentous laxity to  varus/valgus stress.  Unable to assess anterior/posterior drawer given patient's range of motion.  Unable to adequately excess Lachman exam.  No posterior lateral rotational instability.  Able to perform straight leg raise.  No significant tenderness over the patellar tendon, patella, quad tendon.  Specialty Comments:  No specialty comments available.  Imaging: No results found.   PMFS History: Patient Active Problem List   Diagnosis Date Noted  . SIRS (systemic inflammatory response syndrome) ([place]) 02/01/2019  . Allergic reaction 02/01/2019  . 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 Dizziness   . Headache   . Pituitary tumor   . Pseudotumor cerebri     Family History  Problem Relation Age of Onset  . Healthy Mother   . Healthy Father     Past Surgical History:  Procedure Laterality Date  . VENTRICULOPERITONEAL SHUNT  2020   Social History   Occupational History  . Not on file  Tobacco Use  . Smoking status: Former Smoker    Types: Cigarettes  . Smokeless tobacco: Never Used  . Tobacco comment: Occasional cigarette smoker  Vaping Use  . Vaping Use: Never used  Substance and Sexual Activity  . Alcohol use: Never  . Drug use: Yes    Types: Marijuana  . Sexual activity: Yes    Birth control/protection: None    Comment: sexual intercourse with women only

## 2020-07-11 ENCOUNTER — Encounter: Payer: Self-pay | Admitting: Physical Therapy

## 2020-07-11 ENCOUNTER — Ambulatory Visit: Payer: No Typology Code available for payment source | Admitting: Rehabilitative and Restorative Service Providers"

## 2020-07-11 ENCOUNTER — Ambulatory Visit (INDEPENDENT_AMBULATORY_CARE_PROVIDER_SITE_OTHER): Payer: No Typology Code available for payment source | Admitting: Physical Therapy

## 2020-07-11 ENCOUNTER — Other Ambulatory Visit: Payer: Self-pay

## 2020-07-11 DIAGNOSIS — M25662 Stiffness of left knee, not elsewhere classified: Secondary | ICD-10-CM

## 2020-07-11 DIAGNOSIS — R2689 Other abnormalities of gait and mobility: Secondary | ICD-10-CM

## 2020-07-11 DIAGNOSIS — R2681 Unsteadiness on feet: Secondary | ICD-10-CM

## 2020-07-11 DIAGNOSIS — M25562 Pain in left knee: Secondary | ICD-10-CM | POA: Diagnosis not present

## 2020-07-11 NOTE — Patient Instructions (Signed)
Access Code: [email or web address] URL: [email or web address] Date: 07/11/2020 Prepared by: Faustino Congress  Exercises Supine Quad Set on Towel Roll - 10-15 x daily - 7 x weekly - 1 sets - 5-10 reps - 5 sec hold Supine Heel Slide with Strap - 10-15 x daily - 7 x weekly - 1 sets - 5-10 reps Active Straight Leg Raise with Quad Set - 2 x daily - 7 x weekly - 1 sets - 10 reps Sidelying Hip Abduction - 2 x daily - 7 x weekly - 1 sets - 10 reps Sidelying Hip Adduction - 2 x daily - 7 x weekly - 1 sets - 10 reps Prone Hip Extension - 2 x daily - 7 x weekly - 1 sets - 10 reps

## 2020-07-11 NOTE — Therapy (Signed)
[place], [place], [address] Phone: [phone]   Fax:  [phone]  Physical Therapy Evaluation  Patient Details  Name: Paula Haley MRN: [id number] Date of Birth: [birth year] Referring Provider (PT): Dr. Marlou Sa   Encounter Date: 07/11/2020   PT End of Session - 07/11/20 1522    Visit Number 1    Number of Visits 12    Date for PT Re-Evaluation 08/22/20    Authorization Type VA -    Authorization Time Period 05/25/20-11/22/20    Authorization - Visit Number 1    Authorization - Number of Visits 15    Progress Note Due on Visit 10    PT Start Time 0240    PT Stop Time 1517    PT Time Calculation (min) 30 min    Activity Tolerance Patient tolerated treatment well    Behavior During Therapy [facility] for tasks assessed/performed           Past Medical History:  Diagnosis Date  . Dizziness   . Headache   . Pituitary tumor   . Pseudotumor cerebri     Past Surgical History:  Procedure Laterality Date  . VENTRICULOPERITONEAL SHUNT  2020    There were no vitals filed for this visit.    Subjective Assessment - 07/11/20 1450    Subjective Pt is a 20 y/o female who presents to OPPT for Lt ACL tear about 4 weeks ago, with plan for surgical repair once scheduled.  She reports instability with walking at time.    Limitations Standing;Walking    Patient Stated Goals have surgery, be able to run/jump again    Currently in Pain? Yes    Pain Score 6    up to 10/10, at best 6/10   Pain Location Knee    Pain Orientation Left    Pain Descriptors / Indicators Aching;Throbbing;Sharp    Pain Type Acute pain    Pain Onset More than a month ago    Pain Frequency Constant    Aggravating Factors  walking, bending    Pain Relieving Factors rest              [facility] PT Assessment - 07/11/20 0001      Assessment   Medical Diagnosis Lt knee pain, ACL tear    Referring Provider (PT) Dr. Marlou Sa    Onset Date/Surgical Date  06/07/20   approx.   Hand Dominance Right    Next MD Visit surgery    Prior Therapy n/a      Precautions   Precautions Fall      Restrictions   Weight Bearing Restrictions No      Balance Screen   Has the patient fallen in the past 6 months Yes    How many times? at least 3-4    Has the patient had a decrease in activity level because of a fear of falling?  Yes    Is the patient reluctant to leave their home because of a fear of falling?  No      Home Environment   Living Environment Private residence    Living Arrangements Spouse/significant other    Type of [place] to enter    Entrance Stairs-Number of Steps 1    [place] Two level;Bed/bath upstairs    Alternate Level Stairs-Number of Steps 12    Alternate Level Stairs-Rails Left    Home Equipment Crutches      Prior Function  Level of Independence Independent    Vocation Unemployed    [employer] work - quit her job today due to knee    Leisure sports, gym (strengthening - upper body, Rt leg strengthening)      Cognition   Overall Cognitive Status Within Functional Limits for tasks assessed      Observation/Other Assessments   Focus on Therapeutic Outcomes (FOTO)  38 (predicted 70)      ROM / Strength   AROM / PROM / Strength AROM;PROM;Strength      AROM   AROM Assessment Site Knee    Right/Left Knee Right;Left    Right Knee Extension 6    Right Knee Flexion 135    Left Knee Extension -10    Left Knee Flexion 121      PROM   PROM Assessment Site Knee    Right/Left Knee Left    Left Knee Extension -3    Left Knee Flexion 124      Strength   Overall Strength Comments pain with Lt knee testing, tested in sitting    Strength Assessment Site Knee    Right/Left Knee Right;Left    Right Knee Flexion 5/5    Right Knee Extension 5/5    Left Knee Flexion 4/5    Left Knee Extension 4/5      Palpation   Palpation comment no significant swelling noted today - pt  does report episodes of swelling      Ambulation/Gait   Gait Pattern Decreased stance time - left;Decreased step length - right;Decreased hip/knee flexion - left;Antalgic                      Objective measurements completed on examination: See above findings.       [place] Adult PT Treatment/Exercise - 07/11/20 0001      Exercises   Exercises Other Exercises    Other Exercises  see pt instructions -encouraged QS/HS multiple times a day to maximize ROM, added SLR for initiating strengthening/quad control - decreased quad control noted with review today, pt performed 3-5 reps of each exercise                  PT Education - 07/11/20 1519    Education Details HEP    Person(s) Educated Patient    Methods Explanation;Demonstration;Handout    Comprehension Verbalized understanding;Returned demonstration;Need further instruction            PT Short Term Goals - 07/11/20 1527      PT SHORT TERM GOAL #1   Title independent with initial HEP    Status New    Target Date 08/01/20             PT Long Term Goals - 07/11/20 1527      PT LONG TERM GOAL #1   Title demonstrate knowledge of post op HEP for preparation leading up to surgery    Status New    Target Date 08/22/20      PT LONG TERM GOAL #2   Title Lt knee AROM 0-130 in preparation for surgery    Status New    Target Date 08/22/20      PT LONG TERM GOAL #3   Title report pain < 5/10 with amb for improved mobility    Status New    Target Date 08/22/20                  Plan - 07/11/20 1519  Clinical Impression Statement Pt is a 20 y/o who presents to OPPT with Lt ACL tear with plans for surgical repair at later date, which is not yet scheduled.  Pt today lacking ~ 10 deg extension and 15 deg flexion and encouraged consistent work on maximizing ROM before surgery.  Pt also demonstrates gait abnormalties, pain and decreased strength affecting functional mobility.  Pt will benefit from PT  to address deficits listed.    Personal Factors and Comorbidities Comorbidity 1    Comorbidities pituitary tumor s/p VP shunt    Examination-Activity Limitations Bed Mobility;Bend;Squat;Stairs;Stand;Locomotion Level;Transfers    Examination-Participation Restrictions Community Activity;Occupation;Driving    Stability/Clinical Decision Making Stable/Uncomplicated    Clinical Decision Making Low    Rehab Potential Good    PT Frequency 2x / week   1-2x/wk   PT Duration 6 weeks    PT Treatment/Interventions ADLs/Self Care Home Management;Cryotherapy;Electrical Stimulation;Moist Heat;Balance training;Therapeutic exercise;Therapeutic activities;Functional mobility training;Stair training;Gait training;Patient/family education;DME Instruction;Neuromuscular re-education;Manual techniques;Vasopneumatic Device;Taping;Dry needling;Passive range of motion    PT Next Visit Plan review HEP, manual for ROM, get into weight bearing/SLS  if able    PT Home Exercise Plan Access Code: [email or web address]    Consulted and Agree with Plan of Care Patient           Patient will benefit from skilled therapeutic intervention in order to improve the following deficits and impairments:  Abnormal gait,Pain,Decreased strength,Decreased mobility,Difficulty walking,Decreased balance,Decreased range of motion  Visit Diagnosis: Acute pain of left knee - Plan: PT plan of care cert/re-cert  Stiffness of left knee, not elsewhere classified - Plan: PT plan of care cert/re-cert  Other abnormalities of gait and mobility - Plan: PT plan of care cert/re-cert  Unsteadiness on feet - Plan: PT plan of care cert/re-cert     Problem List Patient Active Problem List   Diagnosis Date Noted  . SIRS (systemic inflammatory response syndrome) ([place]) 02/01/2019  . Allergic reaction 02/01/2019  . 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 [place], [place], [address] Phone: [phone]   Fax:  [phone]  Name: Paula Haley MRN: [id number] Date of Birth: [birth year]

## 2020-07-12 ENCOUNTER — Encounter (HOSPITAL_BASED_OUTPATIENT_CLINIC_OR_DEPARTMENT_OTHER): Payer: Self-pay | Admitting: Orthopedic Surgery

## 2020-07-12 ENCOUNTER — Other Ambulatory Visit: Payer: Self-pay

## 2020-07-12 NOTE — Progress Notes (Signed)
Pt's past medical history along with summary of visit 06/30/20 with Dr Kathyrn Sheriff reviewed with Dr. Lanetta Inch. Pt will need clearance from neurosurgery prior to surgery scheduled 07/18/20.   Spoke with Jackelyn Poling at Dr. Randel Pigg office, and she is aware and will request.

## 2020-07-13 ENCOUNTER — Ambulatory Visit: Payer: No Typology Code available for payment source | Admitting: Orthopedic Surgery

## 2020-07-14 ENCOUNTER — Other Ambulatory Visit (HOSPITAL_COMMUNITY): Payer: No Typology Code available for payment source

## 2020-07-17 NOTE — Anesthesia Preprocedure Evaluation (Addendum)
Anesthesia Evaluation  Patient identified by MRN, date of birth, ID band Patient awake    Reviewed: Allergy & Precautions, NPO status , Patient's Chart, lab work & pertinent test results  Airway Mallampati: II  TM Distance: >3 FB     Dental   Pulmonary former smoker,    breath sounds clear to auscultation       Cardiovascular negative cardio ROS   Rhythm:Regular Rate:Normal     Neuro/Psych  Headaches, Anxiety    GI/Hepatic negative GI ROS, Neg liver ROS,   Endo/Other  negative endocrine ROS  Renal/GU negative Renal ROS     Musculoskeletal   Abdominal   Peds  Hematology   Anesthesia Other Findings   Reproductive/Obstetrics                            Anesthesia Physical Anesthesia Plan  ASA: II  Anesthesia Plan: General   Post-op Pain Management:  Regional for Post-op pain   Induction: Intravenous  PONV Risk Score and Plan: 3 and Ondansetron, Dexamethasone and Midazolam  Airway Management Planned: LMA  Additional Equipment:   Intra-op Plan:   Post-operative Plan: Extubation in OR  Informed Consent: I have reviewed the patients History and Physical, chart, labs and discussed the procedure including the risks, benefits and alternatives for the proposed anesthesia with the patient or authorized representative who has indicated his/her understanding and acceptance.     Dental advisory given  Plan Discussed with: CRNA and Anesthesiologist  Anesthesia Plan Comments:         Anesthesia Quick Evaluation

## 2020-07-18 ENCOUNTER — Other Ambulatory Visit: Payer: Self-pay

## 2020-07-18 ENCOUNTER — Other Ambulatory Visit: Payer: Self-pay | Admitting: Surgical

## 2020-07-18 ENCOUNTER — Telehealth: Payer: Self-pay

## 2020-07-18 ENCOUNTER — Encounter (HOSPITAL_BASED_OUTPATIENT_CLINIC_OR_DEPARTMENT_OTHER)
Admission: RE | Disposition: A | Discharge status: Home / Self Care | Payer: Self-pay | Source: Home / Self Care | Attending: Orthopedic Surgery

## 2020-07-18 ENCOUNTER — Encounter (HOSPITAL_BASED_OUTPATIENT_CLINIC_OR_DEPARTMENT_OTHER): Payer: Self-pay | Admitting: Orthopedic Surgery

## 2020-07-18 ENCOUNTER — Ambulatory Visit (HOSPITAL_BASED_OUTPATIENT_CLINIC_OR_DEPARTMENT_OTHER): Payer: No Typology Code available for payment source | Admitting: Anesthesiology

## 2020-07-18 ENCOUNTER — Ambulatory Visit (HOSPITAL_COMMUNITY)
Admission: RE | Admit: 2020-07-18 | Discharge: 2020-07-18 | Disposition: A | Discharge status: Home / Self Care | Payer: No Typology Code available for payment source | Attending: Orthopedic Surgery | Admitting: Orthopedic Surgery

## 2020-07-18 DIAGNOSIS — Z982 Presence of cerebrospinal fluid drainage device: Secondary | ICD-10-CM | POA: Insufficient documentation

## 2020-07-18 DIAGNOSIS — S83512A Sprain of anterior cruciate ligament of left knee, initial encounter: Secondary | ICD-10-CM | POA: Diagnosis not present

## 2020-07-18 DIAGNOSIS — S83512D Sprain of anterior cruciate ligament of left knee, subsequent encounter: Secondary | ICD-10-CM | POA: Diagnosis not present

## 2020-07-18 DIAGNOSIS — Z87891 Personal history of nicotine dependence: Secondary | ICD-10-CM | POA: Insufficient documentation

## 2020-07-18 DIAGNOSIS — Y9301 Activity, walking, marching and hiking: Secondary | ICD-10-CM | POA: Insufficient documentation

## 2020-07-18 DIAGNOSIS — S83242A Other tear of medial meniscus, current injury, left knee, initial encounter: Secondary | ICD-10-CM | POA: Insufficient documentation

## 2020-07-18 DIAGNOSIS — S83242S Other tear of medial meniscus, current injury, left knee, sequela: Secondary | ICD-10-CM | POA: Diagnosis not present

## 2020-07-18 DIAGNOSIS — Z886 Allergy status to analgesic agent status: Secondary | ICD-10-CM | POA: Insufficient documentation

## 2020-07-18 DIAGNOSIS — X58XXXA Exposure to other specified factors, initial encounter: Secondary | ICD-10-CM | POA: Insufficient documentation

## 2020-07-18 DIAGNOSIS — Z8616 Personal history of COVID-19: Secondary | ICD-10-CM | POA: Insufficient documentation

## 2020-07-18 DIAGNOSIS — Z880 Allergy status to penicillin: Secondary | ICD-10-CM | POA: Insufficient documentation

## 2020-07-18 HISTORY — DX: Anxiety disorder, unspecified: F41.9

## 2020-07-18 HISTORY — PX: ANTERIOR CRUCIATE LIGAMENT REPAIR: SHX115

## 2020-07-18 HISTORY — DX: Hydrocephalus, unspecified: G91.9

## 2020-07-18 LAB — BASIC METABOLIC PANEL
Anion gap: 7 (ref 5–15)
BUN: 13 mg/dL (ref 6–20)
CO2: 25 mmol/L (ref 22–32)
Calcium: 9.4 mg/dL (ref 8.9–10.3)
Chloride: 105 mmol/L (ref 98–111)
Creatinine, Ser: 0.67 mg/dL (ref 0.44–1.00)
GFR, Estimated: >60 mL/min (ref >60)
Glucose, Bld: 87 mg/dL (ref 70–99)
Potassium: 3.6 mmol/L (ref 3.5–5.1)
Sodium: 137 mmol/L (ref 135–145)

## 2020-07-18 LAB — CBC
HCT: 42.5 % (ref 36.0–46.0)
Hemoglobin: 14.2 g/dL (ref 12.0–15.0)
MCH: 29.5 pg (ref 26.0–34.0)
MCHC: 33.4 g/dL (ref 30.0–36.0)
MCV: 88.2 fL (ref 80.0–100.0)
Platelets: 299 10*3/uL (ref 150–400)
RBC: 4.82 MIL/uL (ref 3.87–5.11)
RDW: 13.1 % (ref 11.5–15.5)
WBC: 5.5 10*3/uL (ref 4.0–10.5)
nRBC: 0 % (ref 0.0–0.2)

## 2020-07-18 LAB — POCT PREGNANCY, URINE: Preg Test, Ur: NEGATIVE

## 2020-07-18 SURGERY — RECONSTRUCTION, KNEE, ACL, USING HAMSTRING GRAFT
Anesthesia: General | Site: Knee | Laterality: Left

## 2020-07-18 MED ORDER — VANCOMYCIN HCL 500 MG IV SOLR
INTRAVENOUS | Status: AC
  Filled 2020-07-18: qty 500

## 2020-07-18 MED ORDER — HYDROMORPHONE HCL 1 MG/ML IJ SOLN
0.2500 mg | INTRAMUSCULAR | Status: DC | PRN
  Administered 2020-07-18 (×2): 0.5 mg via INTRAVENOUS

## 2020-07-18 MED ORDER — MIDAZOLAM HCL 2 MG/2ML IJ SOLN
INTRAMUSCULAR | Status: AC
  Filled 2020-07-18: qty 2

## 2020-07-18 MED ORDER — MIDAZOLAM HCL 2 MG/2ML IJ SOLN
2.0000 mg | Freq: Once | INTRAMUSCULAR | Status: AC
  Administered 2020-07-18: 2 mg via INTRAVENOUS

## 2020-07-18 MED ORDER — GABAPENTIN 300 MG PO CAPS: 300.0000 mg | ORAL_CAPSULE | Freq: Three times a day (TID) | ORAL | 0 refills | Status: DC

## 2020-07-18 MED ORDER — PROPOFOL 10 MG/ML IV BOLUS
INTRAVENOUS | Status: DC | PRN
  Administered 2020-07-18: 200 mg via INTRAVENOUS

## 2020-07-18 MED ORDER — CLONIDINE HCL (ANALGESIA) 100 MCG/ML EP SOLN
EPIDURAL | Status: DC | PRN
  Administered 2020-07-18: .75 mL

## 2020-07-18 MED ORDER — POVIDONE-IODINE 7.5 % EX SOLN
Freq: Once | CUTANEOUS | Status: AC
  Filled 2020-07-18: qty 118

## 2020-07-18 MED ORDER — LACTATED RINGERS IV SOLN: INTRAVENOUS | Status: DC | PRN

## 2020-07-18 MED ORDER — ONDANSETRON HCL 4 MG/2ML IJ SOLN
INTRAMUSCULAR | Status: DC | PRN
  Administered 2020-07-18: 4 mg via INTRAVENOUS

## 2020-07-18 MED ORDER — ACETAMINOPHEN 10 MG/ML IV SOLN
1000.0000 mg | Freq: Once | INTRAVENOUS | Status: AC
  Administered 2020-07-18: 1000 mg via INTRAVENOUS

## 2020-07-18 MED ORDER — HYDROMORPHONE HCL 1 MG/ML IJ SOLN
INTRAMUSCULAR | Status: AC
  Filled 2020-07-18: qty 0.5

## 2020-07-18 MED ORDER — POVIDONE-IODINE 10 % EX SWAB: 2.0000 "application " | Freq: Once | CUTANEOUS | Status: DC

## 2020-07-18 MED ORDER — FENTANYL CITRATE (PF) 100 MCG/2ML IJ SOLN
INTRAMUSCULAR | Status: AC
  Filled 2020-07-18: qty 2

## 2020-07-18 MED ORDER — BUPIVACAINE-EPINEPHRINE 0.25% -1:200000 IJ SOLN
INTRAMUSCULAR | Status: DC | PRN
  Administered 2020-07-18: 10 mL

## 2020-07-18 MED ORDER — AMISULPRIDE (ANTIEMETIC) 5 MG/2ML IV SOLN
INTRAVENOUS | Status: AC
  Filled 2020-07-18: qty 4

## 2020-07-18 MED ORDER — ROPIVACAINE HCL 5 MG/ML IJ SOLN
INTRAMUSCULAR | Status: DC | PRN
  Administered 2020-07-18: 20 mL via EPIDURAL

## 2020-07-18 MED ORDER — OXYCODONE-ACETAMINOPHEN 5-325 MG PO TABS: 1.0000 | ORAL_TABLET | ORAL | 0 refills | Status: DC | PRN

## 2020-07-18 MED ORDER — DEXAMETHASONE SODIUM PHOSPHATE 10 MG/ML IJ SOLN
INTRAMUSCULAR | Status: DC | PRN
  Administered 2020-07-18: 5 mg via INTRAVENOUS

## 2020-07-18 MED ORDER — LIDOCAINE 2% (20 MG/ML) 5 ML SYRINGE
INTRAMUSCULAR | Status: AC
  Filled 2020-07-18: qty 5

## 2020-07-18 MED ORDER — ASPIRIN 81 MG PO CHEW: 81.0000 mg | CHEWABLE_TABLET | Freq: Every day | ORAL | 0 refills | Status: AC

## 2020-07-18 MED ORDER — PROPOFOL 10 MG/ML IV BOLUS
INTRAVENOUS | Status: AC
  Filled 2020-07-18: qty 20

## 2020-07-18 MED ORDER — POVIDONE-IODINE 10 % EX SWAB
2.0000 "application " | Freq: Once | CUTANEOUS | Status: AC
  Administered 2020-07-18: 2 via TOPICAL

## 2020-07-18 MED ORDER — VANCOMYCIN HCL 500 MG IV SOLR
INTRAVENOUS | Status: DC | PRN
  Administered 2020-07-18: 500 mg

## 2020-07-18 MED ORDER — VANCOMYCIN HCL IN DEXTROSE 1-5 GM/200ML-% IV SOLN
INTRAVENOUS | Status: AC
  Filled 2020-07-18: qty 200

## 2020-07-18 MED ORDER — METHOCARBAMOL 500 MG PO TABS: 500.0000 mg | ORAL_TABLET | Freq: Three times a day (TID) | ORAL | 0 refills | Status: DC | PRN

## 2020-07-18 MED ORDER — SODIUM CHLORIDE 0.9 % IR SOLN
Status: DC | PRN
  Administered 2020-07-18 (×2): 3000 mL

## 2020-07-18 MED ORDER — ONDANSETRON HCL 4 MG/2ML IJ SOLN
INTRAMUSCULAR | Status: AC
  Filled 2020-07-18: qty 2

## 2020-07-18 MED ORDER — BUPIVACAINE HCL (PF) 0.25 % IJ SOLN
INTRAMUSCULAR | Status: DC | PRN
  Administered 2020-07-18: 20 mL

## 2020-07-18 MED ORDER — OXYCODONE HCL 5 MG PO TABS
5.0000 mg | ORAL_TABLET | Freq: Once | ORAL | Status: AC
Start: 2020-07-18 — End: 2020-07-18
  Administered 2020-07-18: 5 mg via ORAL

## 2020-07-18 MED ORDER — MORPHINE SULFATE (PF) 4 MG/ML IV SOLN
INTRAVENOUS | Status: DC | PRN
  Administered 2020-07-18: 8 mg via INTRAMUSCULAR

## 2020-07-18 MED ORDER — LIDOCAINE HCL (CARDIAC) PF 100 MG/5ML IV SOSY
PREFILLED_SYRINGE | INTRAVENOUS | Status: DC | PRN
  Administered 2020-07-18: 100 mg via INTRAVENOUS

## 2020-07-18 MED ORDER — METHOCARBAMOL 500 MG PO TABS
500.0000 mg | ORAL_TABLET | Freq: Three times a day (TID) | ORAL | 0 refills | Status: DC | PRN
Start: 2020-07-18 — End: 2020-08-03

## 2020-07-18 MED ORDER — FENTANYL CITRATE (PF) 100 MCG/2ML IJ SOLN
50.0000 ug | Freq: Once | INTRAMUSCULAR | Status: AC
  Administered 2020-07-18: 50 ug via INTRAVENOUS

## 2020-07-18 MED ORDER — VANCOMYCIN HCL IN DEXTROSE 1-5 GM/200ML-% IV SOLN
1000.0000 mg | INTRAVENOUS | Status: AC
  Administered 2020-07-18: 1000 mg via INTRAVENOUS

## 2020-07-18 MED ORDER — FENTANYL CITRATE (PF) 100 MCG/2ML IJ SOLN
INTRAMUSCULAR | Status: DC | PRN
  Administered 2020-07-18: 50 ug via INTRAVENOUS
  Administered 2020-07-18 (×2): 25 ug via INTRAVENOUS
  Administered 2020-07-18 (×2): 50 ug via INTRAVENOUS

## 2020-07-18 MED ORDER — MIDAZOLAM HCL 2 MG/2ML IJ SOLN
INTRAMUSCULAR | Status: DC | PRN
  Administered 2020-07-18: 2 mg via INTRAVENOUS

## 2020-07-18 MED ORDER — OXYCODONE HCL 5 MG PO TABS
ORAL_TABLET | ORAL | Status: AC
  Filled 2020-07-18: qty 1

## 2020-07-18 MED ORDER — DEXAMETHASONE SODIUM PHOSPHATE 10 MG/ML IJ SOLN
INTRAMUSCULAR | Status: AC
  Filled 2020-07-18: qty 1

## 2020-07-18 MED ORDER — ACETAMINOPHEN 10 MG/ML IV SOLN
INTRAVENOUS | Status: AC
  Filled 2020-07-18: qty 100

## 2020-07-18 MED ORDER — AMISULPRIDE (ANTIEMETIC) 5 MG/2ML IV SOLN
10.0000 mg | Freq: Once | INTRAVENOUS | Status: AC
  Administered 2020-07-18: 10 mg via INTRAVENOUS

## 2020-07-18 SURGICAL SUPPLY — 110 items
ANCH SUT 2-0 CVD FBRSTCH PLSTR (Anchor) ×1 IMPLANT
ANCHOR BUTTON TIGHTROPE RN 14 (Anchor) ×2 IMPLANT
BANDAGE ESMARK 6X9 LF (GAUZE/BANDAGES/DRESSINGS) ×1 IMPLANT
BLADE EXCALIBUR 4.0X13 (MISCELLANEOUS) ×2 IMPLANT
BLADE SHAVER TORPEDO 4X13 (MISCELLANEOUS) IMPLANT
BLADE SURG 10 STRL SS (BLADE) ×2 IMPLANT
BLADE SURG 15 STRL LF DISP TIS (BLADE) ×1 IMPLANT
BLADE SURG 15 STRL SS (BLADE) ×2
BNDG CMPR 9X6 STRL LF SNTH (GAUZE/BANDAGES/DRESSINGS) ×1
BNDG ELASTIC 4X5.8 VLCR STR LF (GAUZE/BANDAGES/DRESSINGS) ×4 IMPLANT
BNDG ELASTIC 6X5.8 VLCR STR LF (GAUZE/BANDAGES/DRESSINGS) ×2 IMPLANT
BNDG ESMARK 6X9 LF (GAUZE/BANDAGES/DRESSINGS) ×2
BURR OVAL 8 FLU 4.0X13 (MISCELLANEOUS) ×2 IMPLANT
COOLER ICEMAN CLASSIC (MISCELLANEOUS) ×2 IMPLANT
COVER BACK TABLE 60X90IN (DRAPES) ×2 IMPLANT
COVER WAND RF STERILE (DRAPES) IMPLANT
CUFF TOURN SGL QUICK 34 (TOURNIQUET CUFF) ×2
CUFF TRNQT CYL 34X4.125X (TOURNIQUET CUFF) ×1 IMPLANT
CUTTER TENSIONER SUT 2-0 0 FBW (INSTRUMENTS) ×2 IMPLANT
DISSECTOR  3.8MM X 13CM (MISCELLANEOUS)
DISSECTOR 3.8MM X 13CM (MISCELLANEOUS) IMPLANT
DISSECTOR 4.0MM X 13CM (MISCELLANEOUS) ×2 IMPLANT
DRAPE ARTHROSCOPY W/POUCH 90 (DRAPES) ×2 IMPLANT
DRAPE IMP U-DRAPE 54X76 (DRAPES) ×2 IMPLANT
DRAPE INCISE IOBAN 66X45 STRL (DRAPES) ×2 IMPLANT
DRAPE OEC MINIVIEW 54X84 (DRAPES) ×2 IMPLANT
DRAPE U-SHAPE 47X51 STRL (DRAPES) IMPLANT
DRILL FLIPCUTTER III 6-12 (ORTHOPEDIC DISPOSABLE SUPPLIES) ×1 IMPLANT
DRSG PAD ABDOMINAL 8X10 ST (GAUZE/BANDAGES/DRESSINGS) IMPLANT
DRSG TEGADERM 4X4.75 (GAUZE/BANDAGES/DRESSINGS) ×8 IMPLANT
DURAPREP 26ML APPLICATOR (WOUND CARE) ×2 IMPLANT
DW OUTFLOW CASSETTE/TUBE SET (MISCELLANEOUS) ×2 IMPLANT
ELECT REM PT RETURN 9FT ADLT (ELECTROSURGICAL) ×2
ELECTRODE REM PT RTRN 9FT ADLT (ELECTROSURGICAL) ×1 IMPLANT
EXCALIBUR 3.8MM X 13CM (MISCELLANEOUS) ×2 IMPLANT
FIBERSTICK 2 (SUTURE) ×2 IMPLANT
FLIPCUTTER III 6-12 AR-1204FF (ORTHOPEDIC DISPOSABLE SUPPLIES) ×2
GAUZE SPONGE 4X4 12PLY STRL (GAUZE/BANDAGES/DRESSINGS) ×2 IMPLANT
GAUZE XEROFORM 1X8 LF (GAUZE/BANDAGES/DRESSINGS) ×4 IMPLANT
GLOVE SRG 8 PF TXTR STRL LF DI (GLOVE) ×1 IMPLANT
GLOVE SURG LTX SZ6.5 (GLOVE) ×6 IMPLANT
GLOVE SURG LTX SZ8 (GLOVE) ×4 IMPLANT
GLOVE SURG UNDER POLY LF SZ7 (GLOVE) ×2 IMPLANT
GLOVE SURG UNDER POLY LF SZ8 (GLOVE) ×2
GOWN STRL REUS W/ TWL LRG LVL3 (GOWN DISPOSABLE) ×2 IMPLANT
GOWN STRL REUS W/ TWL XL LVL3 (GOWN DISPOSABLE) ×2 IMPLANT
GOWN STRL REUS W/TWL LRG LVL3 (GOWN DISPOSABLE) ×4
GOWN STRL REUS W/TWL XL LVL3 (GOWN DISPOSABLE) ×4
IMMOBILIZER KNEE 22 UNIV (SOFTGOODS) IMPLANT
IMMOBILIZER KNEE 24 THIGH 36 (MISCELLANEOUS) ×1 IMPLANT
IMMOBILIZER KNEE 24 UNIV (MISCELLANEOUS) ×2
IMP SYS 2ND FIX PEEK 4.75X19.1 (Miscellaneous) ×2 IMPLANT
IMPL FIBERSTICH 2-0 CVD (Anchor) ×1 IMPLANT
IMPL SYS 2ND FX PEEK 4.75X19.1 (Miscellaneous) ×1 IMPLANT
IMPL TIGHTROP ABS ACL FIBERTG (Orthopedic Implant) ×1 IMPLANT
IMPL TIGHTROP FIBERTAG ACL (Orthopedic Implant) ×1 IMPLANT
IMPL TIGHTROPE ABS ACL FIBERTG (Orthopedic Implant) ×2 IMPLANT
IMPLANT FIBERSTICH 2-0 CVD (Anchor) ×2 IMPLANT
IMPLANT TIGHTROPE FIBERTAG ACL (Orthopedic Implant) ×2 IMPLANT
KIT BIOCARTILAGE LG JOINT MIX (KITS) ×4 IMPLANT
MANIFOLD NEPTUNE II (INSTRUMENTS) ×2 IMPLANT
NDL SAFETY ECLIPSE 18X1.5 (NEEDLE) ×2 IMPLANT
NEEDLE HYPO 18GX1.5 BLUNT FILL (NEEDLE) ×2 IMPLANT
NEEDLE HYPO 18GX1.5 SHARP (NEEDLE) ×4
NEEDLE SPNL 18GX3.5 QUINCKE PK (NEEDLE) ×2 IMPLANT
PACK ARTHROSCOPY DSU (CUSTOM PROCEDURE TRAY) ×2 IMPLANT
PACK BASIN DAY SURGERY FS (CUSTOM PROCEDURE TRAY) ×2 IMPLANT
PAD CAST 4YDX4 CTTN HI CHSV (CAST SUPPLIES) ×1 IMPLANT
PAD COLD SHLDR UNI XL WRAP-ON (PAD) ×2
PAD COLD SHLDR WRAP-ON (PAD) ×2 IMPLANT
PAD COLD UNI XL WRAP-ON (PAD) ×1 IMPLANT
PADDING CAST COTTON 4X4 STRL (CAST SUPPLIES) ×2
PADDING CAST COTTON 6X4 STRL (CAST SUPPLIES) ×2 IMPLANT
PENCIL SMOKE EVACUATOR (MISCELLANEOUS) IMPLANT
PORT APPOLLO RF 90DEGREE MULTI (SURGICAL WAND) ×2 IMPLANT
PORTAL SKID DEVICE (INSTRUMENTS) ×2 IMPLANT
PUTTY DBM ALLOSYNC PURE 5CC (Putty) ×2 IMPLANT
SLEEVE SCD COMPRESS KNEE MED (STOCKING) ×2 IMPLANT
SPONGE LAP 18X18 RF (DISPOSABLE) ×2 IMPLANT
SPONGE LAP 4X18 RFD (DISPOSABLE) ×4 IMPLANT
STRIP CLOSURE SKIN 1/2X4 (GAUZE/BANDAGES/DRESSINGS) ×2 IMPLANT
SUCTION FRAZIER HANDLE 10FR (MISCELLANEOUS) ×1
SUCTION TUBE FRAZIER 10FR DISP (MISCELLANEOUS) ×1 IMPLANT
SUT ETHILON 3 0 PS 1 (SUTURE) ×4 IMPLANT
SUT FIBERWIRE #2 38 T-5 BLUE (SUTURE)
SUT FIBERWIRE 2-0 18 17.9 3/8 (SUTURE)
SUT MNCRL AB 3-0 PS2 18 (SUTURE) ×2 IMPLANT
SUT VIC AB 0 CT1 27 (SUTURE) ×4
SUT VIC AB 0 CT1 27XBRD ANBCTR (SUTURE) ×2 IMPLANT
SUT VIC AB 1 CT1 27 (SUTURE) ×6
SUT VIC AB 1 CT1 27XBRD ANBCTR (SUTURE) ×3 IMPLANT
SUT VIC AB 2-0 CT1 27 (SUTURE) ×2
SUT VIC AB 2-0 CT1 TAPERPNT 27 (SUTURE) ×1 IMPLANT
SUT VIC AB 2-0 SH 27 (SUTURE) ×2
SUT VIC AB 2-0 SH 27XBRD (SUTURE) ×1 IMPLANT
SUT VICRYL 0 UR6 27IN ABS (SUTURE) ×4 IMPLANT
SUTURE FIBERWR #2 38 T-5 BLUE (SUTURE) IMPLANT
SUTURE FIBERWR 2-0 18 17.9 3/8 (SUTURE) IMPLANT
SUTURE TAPE 1.3 FIBERLOP 20 ST (SUTURE) ×1 IMPLANT
SUTURE TAPE TIGERLINK 1.3MM BL (SUTURE) ×1 IMPLANT
SUTURETAPE 1.3 FIBERLOOP 20 ST (SUTURE) ×2
SUTURETAPE TIGERLINK 1.3MM BL (SUTURE) ×2
SYR 5ML LL (SYRINGE) ×2 IMPLANT
SYR BULB IRRIG 60ML STRL (SYRINGE) IMPLANT
SYR TB 1ML LL NO SAFETY (SYRINGE) ×2 IMPLANT
TOWEL GREEN STERILE FF (TOWEL DISPOSABLE) ×4 IMPLANT
TRAY DSU PREP LF (CUSTOM PROCEDURE TRAY) ×2 IMPLANT
TUBING ARTHROSCOPY IRRIG 16FT (MISCELLANEOUS) ×2 IMPLANT
WRAP KNEE MAXI GEL POST OP (GAUZE/BANDAGES/DRESSINGS) ×2 IMPLANT
YANKAUER SUCT BULB TIP NO VENT (SUCTIONS) ×2 IMPLANT

## 2020-07-18 NOTE — H&P (Signed)
Paula Haley is an 20 y.o. female.   Chief Complaint: Left knee pain and instability [id number] Paula Haley is a 20 y.o. female who presents with history of left knee pain and instability.  Patient states that 70months ago she was walking and felt a pop in her left knee with excruciating pain and since then her knee has given out on her and she is unable to bear full weight on the left lower extremity.  She has history of being in the [employer] and was "told she needed surgery".  She has a history of patellar dislocation.  She is ambulating with crutches currently.  She has symptomatic instability throughout the day if she does not use crutches.  She is currently in school Buyer, retail.  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  She enjoys playing basketball in her free time as well as doing some strength training.  She does have a history of an LP shunt.  Uses a knee brace  Past Medical History:  Diagnosis Date  . Anxiety   . Dizziness   . Headache   . History of COVID-19 05/09/2020  . Hydrocephalus ([place])   . Pituitary tumor   . Pseudotumor cerebri     Past Surgical History:  Procedure Laterality Date  . VENTRICULOPERITONEAL SHUNT  2020    Family History  Problem Relation Age of Onset  . Healthy Mother   . Healthy Father    Social History:  reports that she has quit smoking. Her smoking use included cigarettes. She has never used smokeless tobacco. She reports current drug use. Drug: Marijuana. She reports that she does not drink alcohol.  Allergies:  Allergies  Allergen Reactions  . Naproxen Hives  . Penicillins Hives    Medications Prior to Admission  Medication Sig Dispense Refill  . cholecalciferol (VITAMIN D3) 25 MCG (1000 UNIT) tablet Take 1,000 Units by mouth daily.    Marland Kitchen EPINEPHrine (EPIPEN 2-PAK) 0.3 mg/0.3 mL IJ SOAJ injection Inject 0.3 mLs (0.3 mg total) into the muscle as needed for up to 2 doses for anaphylaxis. 1 each 1     Results for orders placed or performed during the hospital encounter of 07/18/20 (from the past 48 hour(s))  Pregnancy, urine POC     Status: None   Collection Time: 07/18/20  8:23 AM  Result Value Ref Range   Preg Test, Ur NEGATIVE NEGATIVE    Comment:        THE SENSITIVITY OF THIS METHODOLOGY IS >24 mIU/mL    No results found.  Review of Systems  Musculoskeletal: Positive for arthralgias.  All other systems reviewed and are negative.   Blood pressure 112/87, pulse 72, temperature (!) 97.5 F (36.4 C), temperature source Oral, resp. rate 15, height 5\' 7"  (1.702 m), weight 68.2 kg, last menstrual period 07/12/2020, SpO2 99 %. Physical Exam Vitals reviewed.  HENT:     Head: Normocephalic.     Nose: Nose normal.     Mouth/Throat:     Mouth: Mucous membranes are moist.  Eyes:     Pupils: Pupils are equal, round, and reactive to light.  Cardiovascular:     Rate and Rhythm: Normal rate.     Pulses: Normal pulses.  Pulmonary:     Effort: Pulmonary effort is normal.  Abdominal:     General: Abdomen is flat.  Skin:    General: Skin is warm.     Capillary Refill: Capillary refill takes less than  2 seconds.  Neurological:     General: No focal deficit present.     Mental Status: She is alert.  Psychiatric:        Mood and Affect: Mood normal.    Ortho exam demonstrates left knee with trace effusion.  Mild tenderness over the lateral joint line but no tenderness over the medial joint line.  Hard to extend past 15 degrees but over the course of the visit she did reach near full extension after relaxing. able to flex to greater than 90 degrees today .  No ligamentous laxity to varus/valgus stress.  .    Patient has anterior drawer and positive Lachman exam.  No effusion in the knee. No posterior lateral rotational instability.  Able to perform straight leg raise.  No significant tenderness over the patellar tendon, patella, quad tendon  Assessment/Plan Patient is a  20 year old female who presents complaint of left knee pain.  She has had left knee symptomatic instability and increased pain with difficulty weightbearing on the left leg since feeling a pop while walking  2 months ago.  She has had MRI scan of the left knee that revealed complete anterior cruciate ligament disruption with suggestion of meniscal damage but no definitive tear identified on the MRI scan.  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  She is not able to do that currently.  She would like to get back to being active as soon as possible.  Discussed ACL reconstruction as well as the graft harvest process and what that entails.  Discussed the recovery timeline.  After lengthy discussion, plan to proceed with surgical intervention with ACL reconstruction of the left knee with quadricep autograft.  We will assess menisci at time of surgery.  Patient given work note.  She will also be sent to physical therapy to work on range of motion prior to her procedure.  Follow-up after procedure.  Patient understands the risk and benefits which include but not limited to infection nerve vessel damage knee stiffness prolonged rehabilitation as well as potential need for more surgery.  Anderson Malta, MD 07/18/2020, 9:43 AM

## 2020-07-18 NOTE — Telephone Encounter (Signed)
resent

## 2020-07-18 NOTE — Anesthesia Procedure Notes (Addendum)
Anesthesia Regional Block: Adductor canal block   Pre-Anesthetic Checklist: ,, timeout performed, Correct Patient, Correct Site, Correct Laterality, Correct Procedure, Correct Position, site marked, Risks and benefits discussed,  Surgical consent,  Pre-op evaluation,  At surgeon's request and post-op pain management  Laterality: Left  Prep: chloraprep       Needles:  Injection technique: Single-shot  Needle Type: Echogenic Stimulator Needle          Additional Needles:   Procedures: Doppler guided,,,, ultrasound used (permanent image in chart),,,,  Narrative:  Start time: 07/18/2020 8:50 AM End time: 07/18/2020 9:10 AM Injection made incrementally with aspirations every 5 mL.  Performed by: Personally  Anesthesiologist: Belinda Block, MD

## 2020-07-18 NOTE — Progress Notes (Signed)
Pt called and states her prescriptions are not available at the pharmacy. Spoke with Dr. Randel Pigg office regarding pt's prescriptions. Dr. Randel Pigg office states they will resend them. RN notified pt.

## 2020-07-18 NOTE — Transfer of Care (Signed)
Immediate Anesthesia Transfer of Care Note  Patient: Sheron Nightingale  Procedure(s) Performed: left knee ACL reconstruction,medial  meniscal repair, debridement, QUADRICEP AUTOGRAFT (Left Knee)  Patient Location: PACU  Anesthesia Type:General and Regional  Level of Consciousness: awake, alert  and oriented  Airway & Oxygen Therapy: Patient Spontanous Breathing and Patient connected to face mask oxygen  Post-op Assessment: Report given to RN and Post -op Vital signs reviewed and stable  Post vital signs: Reviewed and stable  Last Vitals:  Vitals Value Taken Time  BP 143/97 07/18/20 1339  Temp    Pulse 128 07/18/20 1340  Resp 15 07/18/20 1340  SpO2 100 % 07/18/20 1340  Vitals shown include unvalidated device data.  Last Pain:  Vitals:   07/18/20 0845  TempSrc: Oral  PainSc: 0-No pain         Complications: No complications documented.

## 2020-07-18 NOTE — Anesthesia Procedure Notes (Signed)
Procedure Name: LMA Insertion Performed by: Odie Rauen M, CRNA Pre-anesthesia Checklist: Patient identified, Emergency Drugs available, Suction available and Patient being monitored Patient Re-evaluated:Patient Re-evaluated prior to induction Oxygen Delivery Method: Circle system utilized Preoxygenation: Pre-oxygenation with 100% oxygen Induction Type: IV induction Ventilation: Mask ventilation without difficulty LMA: LMA inserted LMA Size: 4.0 Number of attempts: 1 Airway Equipment and Method: Bite block Placement Confirmation: positive ETCO2 Tube secured with: Tape Dental Injury: Teeth and Oropharynx as per pre-operative assessment        

## 2020-07-18 NOTE — Progress Notes (Signed)
Assisted Dr. Green with left, ultrasound guided, adductor canal block. Side rails up, monitors on throughout procedure. See vital signs in flow sheet. Tolerated Procedure well.  

## 2020-07-18 NOTE — Brief Op Note (Signed)
   07/18/2020  7:41 PM  PATIENT:  Paula Haley  20 y.o. female  PRE-OPERATIVE DIAGNOSIS:  left knee anterior cruciate ligament tear, medial meniscal tear  POST-OPERATIVE DIAGNOSIS:  left knee anterior cruciate ligament tear, medial meniscal tear  PROCEDURE:  Procedure(s): left knee ACL reconstruction,medial  meniscal repair, debridement, QUADRICEP AUTOGRAFT  SURGEON:  Surgeon(s): Marlou Sa, Tonna Corner, MD  ASSISTANT: Annie Main, PA  ANESTHESIA:   general  EBL: 15 ml    No intake/output data recorded.  BLOOD ADMINISTERED: none  DRAINS: none   LOCAL MEDICATIONS USED:  none  SPECIMEN:  No Specimen  COUNTS:  YES  TOURNIQUET:   Total Tourniquet Time Documented: Thigh (Left) - 25 minutes Total: Thigh (Left) - 25 minutes   DICTATION: .Other Dictation: Dictation Number [id number]  PLAN OF CARE: Discharge to home after PACU  PATIENT DISPOSITION:  PACU - hemodynamically stable

## 2020-07-18 NOTE — Anesthesia Postprocedure Evaluation (Signed)
Anesthesia Post Note  Patient: Paula Haley  Procedure(s) Performed: left knee ACL reconstruction,medial  meniscal repair, debridement, QUADRICEP AUTOGRAFT (Left Knee)     Patient location during evaluation: PACU Anesthesia Type: General Level of consciousness: awake and alert Pain management: pain level controlled Vital Signs Assessment: post-procedure vital signs reviewed and stable Respiratory status: spontaneous breathing, nonlabored ventilation, respiratory function stable and patient connected to nasal cannula oxygen Cardiovascular status: blood pressure returned to baseline and stable Postop Assessment: no apparent nausea or vomiting Anesthetic complications: no   No complications documented.  Last Vitals:  Vitals:   07/18/20 1430 07/18/20 1500  BP: 122/87 127/85  Pulse: 90 87  Resp: 13 16  Temp:  36.6 C  SpO2: 98% 100%    Last Pain:  Vitals:   07/18/20 1430  TempSrc:   PainSc: 7     LLE Motor Response: Purposeful movement (07/18/20 1500) LLE Sensation: Decreased;Pain;Numbness;Tingling (07/18/20 1500)          Dontrez Pettis

## 2020-07-18 NOTE — Discharge Instructions (Signed)
You had 1,000mg  of Tylenol at 2:00PM.    Regional Anesthesia Blocks  1. Numbness or the inability to move the "blocked" extremity may last from 3-48 hours after placement. The length of time depends on the medication injected and your individual response to the medication. If the numbness is not going away after 48 hours, call your surgeon.  2. The extremity that is blocked will need to be protected until the numbness is gone and the  Strength has returned. Because you cannot feel it, you will need to take extra care to avoid injury. Because it may be weak, you may have difficulty moving it or using it. You may not know what position it is in without looking at it while the block is in effect.  3. For blocks in the legs and feet, returning to weight bearing and walking needs to be done carefully. You will need to wait until the numbness is entirely gone and the strength has returned. You should be able to move your leg and foot normally before you try and bear weight or walk. You will need someone to be with you when you first try to ensure you do not fall and possibly risk injury.  4. Bruising and tenderness at the needle site are common side effects and will resolve in a few days.  5. Persistent numbness or new problems with movement should be communicated to the surgeon or the [facility] [phone] [place] [phone]).     Post Anesthesia Home Care Instructions  Activity: Get plenty of rest for the remainder of the day. A responsible individual must stay with you for 24 hours following the procedure.  For the next 24 hours, DO NOT: -Drive a car -Paediatric nurse -Drink alcoholic beverages -Take any medication unless instructed by your physician -Make any legal decisions or sign important papers.  Meals: Start with liquid foods such as gelatin or soup. Progress to regular foods as tolerated. Avoid greasy, spicy, heavy foods. If nausea and/or  vomiting occur, drink only clear liquids until the nausea and/or vomiting subsides. Call your physician if vomiting continues.  Special Instructions/Symptoms: Your throat may feel dry or sore from the anesthesia or the breathing tube placed in your throat during surgery. If this causes discomfort, gargle with warm salt water. The discomfort should disappear within 24 hours.  If you had a scopolamine patch placed behind your ear for the management of post- operative nausea and/or vomiting:  1. The medication in the patch is effective for 72 hours, after which it should be removed.  Wrap patch in a tissue and discard in the trash. Wash hands thoroughly with soap and water. 2. You may remove the patch earlier than 72 hours if you experience unpleasant side effects which may include dry mouth, dizziness or visual disturbances. 3. Avoid touching the patch. Wash your hands with soap and water after contact with the patch.

## 2020-07-18 NOTE — Telephone Encounter (Signed)
Patient had Surgery today. Pharm Did not get Rx for 1. Gabapentin, 2. Robaxin, 3.Percocet.   I see where the Rx were sent to pharm. They never received. Please resend. Thanks.

## 2020-07-19 ENCOUNTER — Telehealth: Payer: Self-pay

## 2020-07-19 ENCOUNTER — Encounter (HOSPITAL_BASED_OUTPATIENT_CLINIC_OR_DEPARTMENT_OTHER): Payer: Self-pay | Admitting: Orthopedic Surgery

## 2020-07-19 MED ORDER — GABAPENTIN 300 MG PO CAPS: 300.0000 mg | ORAL_CAPSULE | Freq: Three times a day (TID) | ORAL | 0 refills | Status: DC

## 2020-07-19 NOTE — Telephone Encounter (Signed)
I spoke with patient. She feels she has NSAID allergy. I did resend the gabapentin to correct pharmacy.

## 2020-07-19 NOTE — Telephone Encounter (Signed)
I can resend to a different pharmacy.  She can take two pills of the pain medication on occasion but dont take two too often or she risks tylenol overdose.  If she can tolerate NSAIDs, I can send in Toradol as well to help but she has hives with naproxen

## 2020-07-19 NOTE — Addendum Note (Signed)
Addended byLaurann Montana on: 07/19/2020 02:44 PM   Modules accepted: Orders

## 2020-07-19 NOTE — Telephone Encounter (Signed)
Patient called she stated she didn't receive rx for gabapentin that was sent to the pharmacy and she stated the oxycodone isn't working call back:[phone]

## 2020-07-19 NOTE — Op Note (Signed)
NAMEMARCEY, PERSAD MEDICAL RECORD NO: [id number] ACCOUNT NO: [id number] DATE OF BIRTH: [birth year] FACILITY: [facility] LOCATION: [facility] PHYSICIAN: Yetta Barre. Marlou Sa, MD  Operative Report   DATE OF PROCEDURE: 07/18/2020  PREOPERATIVE DIAGNOSES:  Left knee anterior cruciate ligament tear and medial meniscal tear.  POSTOPERATIVE DIAGNOSES:  Left knee anterior cruciate ligament tear and medial meniscal tear.  PROCEDURE:  Left knee anterior cruciate ligament reconstruction using 9 mm quadriceps autograft and medial meniscal repair all-inside using Arthrex vertical mattress suture.  SURGEON:  Attending Meredith Pel, MD  ASSISTANT:  Annie Main, PA  INDICATIONS:  The patient is a 20 year old patient with left knee instability following ACL tear several months ago.  MRI scan confirms ramp lesion, medial meniscus, along with complete ACL tear.  He presents now for operative management after  explanation of risks and benefits.  OPERATIVE FINDINGS:   Examination under anesthesia, the patient had hyperextension bilaterally of about 8 degrees.  Full flexion bilaterally.  On the left, no posterolateral rotatory instability.  Collateral stable to varus and valgus stress at 0, 30 and  90 degrees.  No posterolateral rotatory instability is noted.  The patient does have ACL laxity with about 1-2 mm side-to-side difference left versus right.  DESCRIPTION OF PROCEDURE:  The patient was brought to the operating room where general anesthetic was induced.  Preoperative antibiotics were administered.  Timeout was called.  Left leg prescrubbed with alcohol and Betadine, allow to air dry.   Prepped  with DuraPrep solution and draped in a sterile manner.  Ioban used to cover the operative field.  The leg was elevated, exsanguinated with the Esmarch wrap.  Tourniquet was inflated for a total tourniquet time of 25 minutes for graft harvest.  Incision  made from the superior pole of the patella proximally.   Skin and subcutaneous tissue were sharply divided.  Quadriceps tendon was identified.  Using a 9 mm, double wide knife, a 75 mm graft was harvested and prepared on the back table using Arthrex dual  EndoButton technique.  The incision was irrigated and closed using #1 Vicryl suture.  Next, 0 Vicryl suture, 2-0 Vicryl sutures were used.  Tourniquet was released, bleeding points encountered prior to closure, coagulated with electrocautery.  Next,  anterior inferolateral and anterior inferomedial portals were established.  Diagnostic arthroscopy was performed.  The patient did have a complete ACL tear with the ACL stump scarred into the PCL.  Lateral compartment articular cartilage and meniscus  intact, patellofemoral joint intact.  Medial compartment had ramp lesion over about a 1 cm area of that medial meniscus.  This was debrided using a ball rasp.  Arthrex all-inside suture device placed with good fixation achieved and good restoration of  stability to that part of the meniscal tear.  Notchplasty performed.  Stump debrided.  Using a FlipCutter size 9 on the femur, tunnel was drilled at the 3 o'clock position.  The 1 mm back wall visualized.  Tibial tunnel was then drilled in the posterior  aspect of the native ACL footprint with a 9.5 mm drill.  Graft was then passed with bone grafting performed both in the femoral and tibial tunnels.  Fluoroscopy used to confirm flipping of the EndoButton.  Next, the graft was passed into the tibial  tunnel.  The graft was secured on the tibial side using EndoButton technique in full extension and SwiveLock backup fixation.  The InternalBrace was also tensioned in full extension.  The patient had excellent range of motion, good stability of  the graft  at the conclusion of the case.  Thorough irrigation of all incisions and portals and the knee joint was performed.  A solution of Marcaine, morphine, clonidine injected into the knee after closure of the portals using 2-0  Vicryl, 3-0 nylon.  The other  incisions were closed using 3-0 Monocryl to finish the closure on the quad tendon harvest site and 3-0 nylon on the tibial tunnel drill site.  Impervious dressing was placed along with IceMan knee immobilizer.  The patient tolerated the procedure well  without immediate complication and was transferred to recovery room in stable condition.  Luke's assistance was required at all times during the case for graft preparation, opening, closing, limb mobilization, tunnel drilling.  His assistance was a  medical necessity.   SHW D: 07/18/2020 7:47:24 pm T: 07/19/2020 9:11:00 am  JOB: [id number]/ [id number]

## 2020-07-19 NOTE — Telephone Encounter (Signed)
Please advise 

## 2020-07-25 ENCOUNTER — Encounter: Payer: No Typology Code available for payment source | Admitting: Physical Therapy

## 2020-07-27 ENCOUNTER — Inpatient Hospital Stay: Payer: No Typology Code available for payment source | Admitting: Orthopedic Surgery

## 2020-07-27 DIAGNOSIS — S83242S Other tear of medial meniscus, current injury, left knee, sequela: Secondary | ICD-10-CM

## 2020-07-27 DIAGNOSIS — S83512A Sprain of anterior cruciate ligament of left knee, initial encounter: Secondary | ICD-10-CM

## 2020-07-28 ENCOUNTER — Telehealth: Payer: Self-pay

## 2020-07-28 ENCOUNTER — Inpatient Hospital Stay: Payer: No Typology Code available for payment source | Admitting: Orthopedic Surgery

## 2020-07-28 NOTE — Telephone Encounter (Signed)
tried to call patient regarding her post op appointment patient didn't answer the phone I lvm for patient stating she needs to be seen tomorrow morning with Dr.Dean call back:[phone]

## 2020-07-28 NOTE — Telephone Encounter (Signed)
Tried calling patient from [facility] office. No answer. Please work patient in on Dr [employer] schedule tomorrow morning.

## 2020-07-28 NOTE — Telephone Encounter (Signed)
Patient came in for her post op appointment today she had surgery 3/14 Paula Haley was running a few minutes behind patient couldn't wait any longer to be seen due to having another appointment please call patient to r/s patient stated if she doesn't answer lvm call back:[phone]

## 2020-07-29 ENCOUNTER — Inpatient Hospital Stay: Payer: No Typology Code available for payment source | Admitting: Surgical

## 2020-08-01 ENCOUNTER — Encounter: Payer: No Typology Code available for payment source | Admitting: Physical Therapy

## 2020-08-03 ENCOUNTER — Other Ambulatory Visit: Payer: Self-pay

## 2020-08-03 ENCOUNTER — Encounter: Payer: Self-pay | Admitting: Orthopedic Surgery

## 2020-08-03 ENCOUNTER — Ambulatory Visit (INDEPENDENT_AMBULATORY_CARE_PROVIDER_SITE_OTHER): Payer: No Typology Code available for payment source | Admitting: Orthopedic Surgery

## 2020-08-03 DIAGNOSIS — S83512A Sprain of anterior cruciate ligament of left knee, initial encounter: Secondary | ICD-10-CM

## 2020-08-03 MED ORDER — METHOCARBAMOL 500 MG PO TABS: 500.0000 mg | ORAL_TABLET | Freq: Three times a day (TID) | ORAL | 0 refills | Status: DC | PRN

## 2020-08-03 MED ORDER — CELECOXIB 100 MG PO CAPS: 100.0000 mg | ORAL_CAPSULE | Freq: Two times a day (BID) | ORAL | 0 refills | Status: DC

## 2020-08-03 MED ORDER — HYDROCODONE-ACETAMINOPHEN 5-325 MG PO TABS: 1.0000 | ORAL_TABLET | Freq: Four times a day (QID) | ORAL | 0 refills | Status: DC | PRN

## 2020-08-03 MED ORDER — GABAPENTIN 300 MG PO CAPS: 300.0000 mg | ORAL_CAPSULE | Freq: Three times a day (TID) | ORAL | 0 refills | Status: DC

## 2020-08-03 NOTE — Progress Notes (Signed)
Post-Op Visit Note   Patient: Paula Haley           Date of Birth: Apr 23, 2001           MRN: [id number] Visit Date: 08/03/2020 PCP: [facility] [place]:  Chief Complaint:  Chief Complaint  Patient presents with  . Left Knee - Routine Post Op   Visit Diagnoses:  1. Complete tear of anterior cruciate ligament of left knee, initial encounter     Plan: Patient is a 20 year old female who presents s/p left knee ACL reconstruction with quadricep autograft as well as all inside suture repair of meniscal ramp lesion on 07/18/2020.  She has remained nonweightbearing since surgery due to pain.  She has not been using the CPM machine much as she has had severe pain.  Localizes pain diffusely throughout the knee.  On exam she has incisions healing well without any significant evidence of infection or dehiscence.  Small effusion is present.  ACL graft is stable on Lachman exam.  She is lacking about 8 degrees of full extension and only flexes to 25 to 30 degrees.  She is not able to do a straight leg raise.  No calf tenderness.  Negative Homans' sign.  Discussed patient's lack of motion today and heavily emphasized that she has to really get her flexion and extension improved significantly over the next 2 weeks.  She has an appointment with physical therapy this coming Friday.  Goal is to reach 90 degrees of knee flexion and 0 degrees of knee extension by her next appointment in 2 weeks.  Patient understands that this is a painful endeavor but she must do all that she can day in and day out in order to achieve optimal range of motion.  She is an active person and is discouraged by her lack of mobility but in order to return to doing the activities she would like to do, she is going to have to withstand some pain with getting her motion back.  She understands this.  Plan to continue with physical therapy and home exercises and CPM machine in order to improve her range of motion.   Follow-up in 2 weeks.  Okay for weightbearing as tolerated on the operative leg.  She is not able to do any straight leg raises so recommended she use her knee immobilizer when ambulatory until she can do 10-15 in a row.  Follow-Up Instructions: No follow-ups on file.   Orders:  No orders of the defined types were placed in this encounter.  Meds ordered this encounter  Medications  . gabapentin (NEURONTIN) 300 MG capsule    Sig: Take 1 capsule (300 mg total) by mouth 3 (three) times daily.    Dispense:  60 capsule    Refill:  0  . celecoxib (CELEBREX) 100 MG capsule    Sig: Take 1 capsule (100 mg total) by mouth 2 (two) times daily.    Dispense:  60 capsule    Refill:  0  . HYDROcodone-acetaminophen (NORCO/VICODIN) 5-325 MG tablet    Sig: Take 1 tablet by mouth every 6 (six) hours as needed for moderate pain.    Dispense:  30 tablet    Refill:  0  . methocarbamol (ROBAXIN) 500 MG tablet    Sig: Take 1 tablet (500 mg total) by mouth every 8 (eight) hours as needed.    Dispense:  30 tablet    Refill:  0    Imaging: No results found.  PMFS History: Patient Active Problem List   Diagnosis Date Noted  . Left anterior cruciate ligament tear   . Acute medial meniscal tear, left, sequela   . SIRS (systemic inflammatory response syndrome) ([place]) 02/01/2019  . Allergic reaction 02/01/2019  . 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 Anxiety   . Dizziness   . Headache   . History of COVID-19 05/09/2020  . Hydrocephalus ([place])   . Pituitary tumor   . Pseudotumor cerebri     Family History  Problem Relation Age of Onset  . Healthy Mother   . Healthy Father     Past Surgical History:  Procedure Laterality Date  . ANTERIOR CRUCIATE LIGAMENT REPAIR Left 07/18/2020   Procedure: left knee ACL reconstruction,medial  meniscal repair, debridement, QUADRICEP AUTOGRAFT;  Surgeon: Meredith Pel, MD;  Location: [facility];  Service:  Orthopedics;  Laterality: Left;  Marland Kitchen VENTRICULOPERITONEAL SHUNT  2020   Social History   Occupational History  . Not on file  Tobacco Use  . Smoking status: Former Smoker    Types: Cigarettes  . Smokeless tobacco: Never Used  . Tobacco comment: Occasional cigarette smoker  Vaping Use  . Vaping Use: Never used  Substance and Sexual Activity  . Alcohol use: Never  . Drug use: Yes    Types: Marijuana    Comment: former user  . Sexual activity: Yes    Birth control/protection: None    Comment: sexual intercourse with women only

## 2020-08-05 ENCOUNTER — Telehealth: Payer: Self-pay | Admitting: Rehabilitative and Restorative Service Providers"

## 2020-08-05 ENCOUNTER — Encounter: Payer: No Typology Code available for payment source | Admitting: Rehabilitative and Restorative Service Providers"

## 2020-08-05 NOTE — Telephone Encounter (Signed)
PT attempted to call patient at her preferred number. Automation stated "Call could not be completed at this time. Try your call later."

## 2020-08-08 ENCOUNTER — Encounter: Payer: No Typology Code available for payment source | Admitting: Physical Therapy

## 2020-08-10 ENCOUNTER — Other Ambulatory Visit: Payer: Self-pay

## 2020-08-10 ENCOUNTER — Telehealth: Payer: Self-pay | Admitting: Orthopedic Surgery

## 2020-08-10 MED ORDER — HYDROCODONE-ACETAMINOPHEN 5-325 MG PO TABS: 1.0000 | ORAL_TABLET | Freq: Four times a day (QID) | ORAL | 0 refills | Status: DC | PRN

## 2020-08-10 NOTE — Telephone Encounter (Signed)
Please advise 

## 2020-08-10 NOTE — Telephone Encounter (Signed)
Sent in rx for [employer]

## 2020-08-10 NOTE — Telephone Encounter (Signed)
Patient states she had 4 prescriptions and only could get 2. Patient is asking for a refill of hydrocodone. She states she only has 2 left due to taking them every 4 hours. Please send to pharmacy on file. Patient phone number is [phone].

## 2020-08-10 NOTE — Telephone Encounter (Signed)
Please advise. Thanks.  

## 2020-08-11 NOTE — Telephone Encounter (Signed)
I called patient to advise. Number not in service.

## 2020-08-12 ENCOUNTER — Ambulatory Visit (INDEPENDENT_AMBULATORY_CARE_PROVIDER_SITE_OTHER): Payer: No Typology Code available for payment source | Admitting: Physical Therapy

## 2020-08-12 ENCOUNTER — Other Ambulatory Visit: Payer: Self-pay

## 2020-08-12 DIAGNOSIS — M25662 Stiffness of left knee, not elsewhere classified: Secondary | ICD-10-CM | POA: Diagnosis not present

## 2020-08-12 DIAGNOSIS — M25562 Pain in left knee: Secondary | ICD-10-CM

## 2020-08-12 DIAGNOSIS — R2681 Unsteadiness on feet: Secondary | ICD-10-CM | POA: Diagnosis not present

## 2020-08-12 DIAGNOSIS — R2689 Other abnormalities of gait and mobility: Secondary | ICD-10-CM | POA: Diagnosis not present

## 2020-08-12 NOTE — Therapy (Signed)
[facility] [place], [place], [address] Phone: [phone]   Fax:  [phone]  Physical Therapy Treatment/Re-Evaluation  Patient Details  Name: Paula Haley MRN: [id number] Date of Birth: [birth year] Referring Provider (PT): Dr. Marlou Sa   Encounter Date: 08/12/2020   PT End of Session - 08/12/20 0941    Visit Number 2    Number of Visits 15    Date for PT Re-Evaluation 11/22/20    Authorization Type VA -    Authorization Time Period 05/25/20-11/22/20    Authorization - Visit Number 2    Authorization - Number of Visits 15    Progress Note Due on Visit 10    PT Start Time 0820    PT Stop Time 0920    PT Time Calculation (min) 60 min    Activity Tolerance Patient tolerated treatment well    Behavior During Therapy [facility] for tasks assessed/performed           Past Medical History:  Diagnosis Date  . Anxiety   . Dizziness   . Headache   . History of COVID-19 05/09/2020  . Hydrocephalus ([place])   . Pituitary tumor   . Pseudotumor cerebri     Past Surgical History:  Procedure Laterality Date  . ANTERIOR CRUCIATE LIGAMENT REPAIR Left 07/18/2020   Procedure: left knee ACL reconstruction,medial  meniscal repair, debridement, QUADRICEP AUTOGRAFT;  Surgeon: Meredith Pel, MD;  Location: [facility];  Service: Orthopedics;  Laterality: Left;  Marland Kitchen VENTRICULOPERITONEAL SHUNT  2020    There were no vitals filed for this visit.   Subjective Assessment - 08/12/20 0831    Subjective She relays she is 6/10 knee pain, when asked if she can put weight through her leg today she says she isnt supposed to, however MD notes say she is WBAT, she was encouraged to try to put more weight though her knee at least touch down weight bearing with her crutches.    Limitations Standing;Walking    Patient Stated Goals have surgery, be able to run/jump again    Pain Onset More than a month ago              [facility] PT Assessment -  08/12/20 0001      Assessment   Medical Diagnosis Lt knee ACL repair with medial meniscus repair 3/14    Referring Provider (PT) Dr. Marlou Sa    Onset Date/Surgical Date 07/18/20    Next MD Visit 4/13      Restrictions   Other Position/Activity Restrictions WBAT with crutches, knee immobilizer until she can do 15 SLR      AROM   Left Knee Extension -14    Left Knee Flexion 50      PROM   Left Knee Extension -10    Left Knee Flexion 60      Strength   Overall Strength Comments unable to perform SLR unassisted                         [facility] Adult PT Treatment/Exercise - 08/12/20 0001      Ambulation/Gait   Gait Comments arrives with bilat crutches hop pattern without WB on her Lt leg, after gait training and education that she is WBAT she was able to progress to touch down weight bearing step to pattern with bilat crutches      Exercises   Exercises Knee/Hip      Knee/Hip Exercises: Stretches   Passive Hamstring  Stretch Left;10 seconds    Passive Hamstring Stretch Limitations 10 reps with strap    Knee: Self-Stretch Limitations supine heelslides on table AAROM 10 sec X10, then supine heelslides with foot on red ball AAROM with strap 10 sec X10, then LLLD stretching over basketball 3 min, then sitting tailgate stretch using gravity 30 sec X5    Other Knee/Hip Stretches heel prop 3 min      Knee/Hip Exercises: Standing   Other Standing Knee Exercises weight shifting with bilat UE support X20 lateral and X20 A-P and X20 P-A      Knee/Hip Exercises: Supine   Quad Sets Left    Quad Sets Limitations 5 sec X15 with towel under her knee, then NMES 10 minutes )5 sec on/off 5 min with towel roll under knee, 5 min with towel roll under heel. Attempted SAQ but unable      Modalities   Modalities Pensions consultant NMES/Russian    Electrical  Stimulation Parameters 5/5 sec on/off    Electrical Stimulation Goals Strength      Vasopneumatic   Number Minutes Vasopneumatic  10 minutes    Vasopnuematic Location  Knee    Vasopneumatic Pressure Medium    Vasopneumatic Temperature  34      Manual Therapy   Manual therapy comments Lt knee PROM flexion and extension, gentle mobs into flexion                  PT Education - 08/12/20 0940    Education Details HEP revision, importance of stretching at home, touch down weight bearing with crutches and she can progress WBAT.            PT Short Term Goals - 08/12/20 0958      PT SHORT TERM GOAL #1   Title independent with initial HEP    Baseline was not compiant, and revised today    Status On-going    Target Date 08/01/20             PT Long Term Goals - 08/12/20 0958      PT LONG TERM GOAL #1   Title She will be I and compliant with final HEP.    Time 12    Period Weeks    Status New    Target Date 11/04/20      PT LONG TERM GOAL #2   Title Lt knee AROM 0-130 for WNL knee ROM to improve function    Time 12    Period Weeks    Status New    Target Date 11/04/20      PT LONG TERM GOAL #3   Title report pain < 3/10 with amb for improved mobility    Time 12    Period Weeks    Status New    Target Date 11/04/20      PT LONG TERM GOAL #4   Title She will improve Lt hip/knee strength to 5/5 MMT to improve function    Time 12    Period Weeks    Status New      PT LONG TERM GOAL #5   Title She will able to ambulate community distances 1,000 feet WNL gait pattern independently and on even/uneven surfaces and stairs.    Time 12    Status New  Plan - 08/12/20 0942    Clinical Impression Statement She arrives for RE-evaluaiton as she is now S/P ACL repair with quadricep autograft and  meniscal debridement 07/18/20. She has only attended one PT visit post op as she states she "did not know she would have PT so early on after surgery,  she also states she is not using CPM because it is too hard for her to set up by herself". She has very limited ROM and quad activiation in her Lt knee and is overall behind the curve for rehab outcomes. Spent extensive time today highly encouraging her to stretch more, attend PT more, and to begin WBAT with her crutches. It appears she has mental barriers in place that we tried to break through today. We started NMES/Russian today for quad activation as she still cannot perform SLR. We will progress her as tolerated.    Personal Factors and Comorbidities Comorbidity 1    Comorbidities pituitary tumor s/p VP shunt    Examination-Activity Limitations Bed Mobility;Bend;Squat;Stairs;Stand;Locomotion Level;Transfers    Examination-Participation Restrictions Community Activity;Occupation;Driving    Stability/Clinical Decision Making Stable/Uncomplicated    Rehab Potential Good    PT Frequency 2x / week   1-2x/wk   PT Duration 6 weeks    PT Treatment/Interventions ADLs/Self Care Home Management;Cryotherapy;Electrical Stimulation;Moist Heat;Balance training;Therapeutic exercise;Therapeutic activities;Functional mobility training;Stair training;Gait training;Patient/family education;DME Instruction;Neuromuscular re-education;Manual techniques;Vasopneumatic Device;Taping;Dry needling;Passive range of motion    PT Next Visit Plan what did MD say? manual for ROM, get into weight bearing as tolerated, NMES for quad activation, BFR    PT Home Exercise Plan Access Code: [email or web address]    Consulted and Agree with Plan of Care Patient           Patient will benefit from skilled therapeutic intervention in order to improve the following deficits and impairments:  Abnormal gait,Pain,Decreased strength,Decreased mobility,Difficulty walking,Decreased balance,Decreased range of motion  Visit Diagnosis: Acute pain of left knee  Stiffness of left knee, not elsewhere classified  Other abnormalities of gait and  mobility  Unsteadiness on feet     Problem List Patient Active Problem List   Diagnosis Date Noted  . Left anterior cruciate ligament tear   . Acute medial meniscal tear, left, sequela   . SIRS (systemic inflammatory response syndrome) ([place]) 02/01/2019  . Allergic reaction 02/01/2019  . 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 [place], [place], [address] Phone: [phone]   Fax:  [phone]  Name: Leinaala Catanese MRN: [id number] Date of Birth: 09/24/2000

## 2020-08-12 NOTE — Patient Instructions (Signed)
Access Code: [email or web address] URL: [email or web address] Date: 08/12/2020 Prepared by: Elsie Ra  Exercises Heel Prop - 2 x daily - 6 x weekly - 1 sets - 1 reps - 5 min hold Supine Quad Set on Towel Roll - 10-15 x daily - 7 x weekly - 1 sets - 5-10 reps - 5 sec hold Seated Knee Flexion Stretch - 5 x daily - 6 x weekly - 2 sets - 10 reps - 5 sec hold Supine Heel Slide with Strap - 10-15 x daily - 7 x weekly - 1 sets - 5-10 reps Side to Side Weight Shift with Counter Support - 2 x daily - 6 x weekly - 1-2 sets - 20 reps Staggered Stance Forward Backward Weight Shift with Counter Support - 2 x daily - 6 x weekly - 1-2 sets - 20 reps Sidelying Hip Abduction - 2 x daily - 7 x weekly - 1 sets - 10 reps Sidelying Hip Adduction - 2 x daily - 7 x weekly - 1 sets - 10 reps Prone Hip Extension - 2 x daily - 7 x weekly - 1 sets - 10 reps

## 2020-08-15 ENCOUNTER — Encounter: Payer: No Typology Code available for payment source | Admitting: Physical Therapy

## 2020-08-17 ENCOUNTER — Ambulatory Visit (INDEPENDENT_AMBULATORY_CARE_PROVIDER_SITE_OTHER): Payer: No Typology Code available for payment source | Admitting: Orthopedic Surgery

## 2020-08-17 ENCOUNTER — Other Ambulatory Visit: Payer: Self-pay

## 2020-08-17 DIAGNOSIS — S83512A Sprain of anterior cruciate ligament of left knee, initial encounter: Secondary | ICD-10-CM

## 2020-08-17 NOTE — Telephone Encounter (Signed)
Please advise 

## 2020-08-18 ENCOUNTER — Other Ambulatory Visit: Payer: Self-pay | Admitting: Surgical

## 2020-08-18 ENCOUNTER — Encounter: Payer: No Typology Code available for payment source | Admitting: Physical Therapy

## 2020-08-18 MED ORDER — HYDROCODONE-ACETAMINOPHEN 5-325 MG PO TABS: 1.0000 | ORAL_TABLET | Freq: Three times a day (TID) | ORAL | 0 refills | Status: DC | PRN

## 2020-08-20 ENCOUNTER — Encounter: Payer: Self-pay | Admitting: Orthopedic Surgery

## 2020-08-20 NOTE — Progress Notes (Signed)
   Post-Op Visit Note   Patient: Paula Haley           Date of Birth: 2001-04-16           MRN: [id number] Visit Date: 08/17/2020 PCP: Clinic, [place]:  Chief Complaint:  Chief Complaint  Patient presents with  . Left Knee - Pain   Visit Diagnoses:  1. Complete tear of anterior cruciate ligament of left knee, initial encounter     Plan: Paula Haley is a 20 year old patient underwent left knee ACL reconstruction with quad autograft 07/18/2020.  Been going to physical therapy.  CPM is at 72.  On exam today she has range of motion 15-60.  Graft is stable.  We have discussed with Paula Haley at length and to a great extent the necessity to achieve her range of motion to achieve full extension so she does not limp as well as to achieve flexion.  I think we could get a little bit more flexion with manipulation but full extension will be difficult to achieve unless she actually stretches out the knee.  Showed her extensively had to do that on her own.  Continue with therapy to achieve that.  Need to see her back in 3 weeks to decide for or against manipulation at that time.  Follow-Up Instructions: Return in about 3 weeks (around 09/07/2020).   Orders:  No orders of the defined types were placed in this encounter.  No orders of the defined types were placed in this encounter.   Imaging: No results found.  PMFS History: Patient Active Problem List   Diagnosis Date Noted  . Left anterior cruciate ligament tear   . Acute medial meniscal tear, left, sequela   . SIRS (systemic inflammatory response syndrome) ([place]) 02/01/2019  . Allergic reaction 02/01/2019  . 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 Anxiety   . Dizziness   . Headache   . History of COVID-19 05/09/2020  . Hydrocephalus ([place])   . Pituitary tumor   . Pseudotumor cerebri     Family History  Problem Relation Age of Onset  . Healthy Mother   . Healthy Father     Past  Surgical History:  Procedure Laterality Date  . ANTERIOR CRUCIATE LIGAMENT REPAIR Left 07/18/2020   Procedure: left knee ACL reconstruction,medial  meniscal repair, debridement, QUADRICEP AUTOGRAFT;  Surgeon: Meredith Pel, MD;  Location: [facility];  Service: Orthopedics;  Laterality: Left;  Marland Kitchen VENTRICULOPERITONEAL SHUNT  2020   Social History   Occupational History  . Not on file  Tobacco Use  . Smoking status: Former Smoker    Types: Cigarettes  . Smokeless tobacco: Never Used  . Tobacco comment: Occasional cigarette smoker  Vaping Use  . Vaping Use: Never used  Substance and Sexual Activity  . Alcohol use: Never  . Drug use: Yes    Types: Marijuana    Comment: former user  . Sexual activity: Yes    Birth control/protection: None    Comment: sexual intercourse with women only

## 2020-08-26 ENCOUNTER — Other Ambulatory Visit: Payer: Self-pay

## 2020-08-26 ENCOUNTER — Ambulatory Visit (INDEPENDENT_AMBULATORY_CARE_PROVIDER_SITE_OTHER): Payer: No Typology Code available for payment source | Admitting: Physical Therapy

## 2020-08-26 DIAGNOSIS — M25562 Pain in left knee: Secondary | ICD-10-CM | POA: Diagnosis not present

## 2020-08-26 DIAGNOSIS — R2681 Unsteadiness on feet: Secondary | ICD-10-CM

## 2020-08-26 DIAGNOSIS — M25662 Stiffness of left knee, not elsewhere classified: Secondary | ICD-10-CM | POA: Diagnosis not present

## 2020-08-26 DIAGNOSIS — R2689 Other abnormalities of gait and mobility: Secondary | ICD-10-CM | POA: Diagnosis not present

## 2020-08-26 NOTE — Therapy (Signed)
[place], [place], [address] Phone: [phone]   Fax:  [phone]  Physical Therapy Treatment  Patient Details  Name: Paula Haley MRN: [id number] Date of Birth: [birth year] Referring Provider (PT): Dr. Marlou Sa   Encounter Date: 08/26/2020   PT End of Session - 08/26/20 1135    Visit Number 3    Number of Visits 15    Date for PT Re-Evaluation 11/22/20    Authorization Type VA -    Authorization Time Period 05/25/20-11/22/20    Authorization - Visit Number 3    Authorization - Number of Visits 15    Progress Note Due on Visit 10    PT Start Time 1055    PT Stop Time 1138    PT Time Calculation (min) 43 min    Activity Tolerance Patient tolerated treatment well    Behavior During Therapy [facility] for tasks assessed/performed           Past Medical History:  Diagnosis Date  . Anxiety   . Dizziness   . Headache   . History of COVID-19 05/09/2020  . Hydrocephalus ([place])   . Pituitary tumor   . Pseudotumor cerebri     Past Surgical History:  Procedure Laterality Date  . ANTERIOR CRUCIATE LIGAMENT REPAIR Left 07/18/2020   Procedure: left knee ACL reconstruction,medial  meniscal repair, debridement, QUADRICEP AUTOGRAFT;  Surgeon: Meredith Pel, MD;  Location: [facility];  Service: Orthopedics;  Laterality: Left;  Marland Kitchen VENTRICULOPERITONEAL SHUNT  2020    There were no vitals filed for this visit.   Subjective Assessment - 08/26/20 1104    Subjective She relays 4/10 overall pain in her Lt knee today. She says she has been working hard to stretch her knee out.    Limitations Standing;Walking    Patient Stated Goals have surgery, be able to run/jump again    Pain Onset More than a month ago              [facility] PT Assessment - 08/26/20 0001      Assessment   Medical Diagnosis Lt knee ACL repair with medial meniscus repair 3/14    Referring Provider (PT) Dr. Marlou Sa    Onset Date/Surgical Date  07/18/20      AROM   Left Knee Extension -3    Left Knee Flexion 65   AAROM with strap     PROM   Left Knee Extension -1    Left Knee Flexion 67      Strength   Overall Strength Comments unable to perform SLR unassisted, 3-, can move through small part or ROM with compensation, needs min A for full SLR      Ambulation/Gait   Gait Comments now is able to tolerate weight bearing with bilat crutches                         [facility] Adult PT Treatment/Exercise - 08/26/20 0001      Knee/Hip Exercises: Stretches   Knee: Self-Stretch Limitations seated tailgate stretch 10 sec X10, supine heelslides on table AAROM 5 sec X15,    Other Knee/Hip Stretches heel prop 5 min with 5#      Knee/Hip Exercises: Aerobic   Nustep 5 min holding 5 sec into max flexion and extension      Knee/Hip Exercises: Standing   Other Standing Knee Exercises weight shifting with bilat UE support X20 with marches  Knee/Hip Exercises: Seated   Long Arc Quad Strengthening;Left    Long Arc Quad Limitations with russian NMES 5/5 on/off 4 min      Knee/Hip Exercises: Supine   Short Arc Quad Sets Strengthening;Left    Short Arc Quad Sets Limitations with russian NMES 5/5 on/off 4 min    Straight Leg Raises Strengthening;Left    Straight Leg Raises Limitations needs min A. with russian NMES 5/5 on/off 2 min      Software engineer Action NMES/Russian    Electrical Stimulation Parameters 5/5 on/off, 12 min total    Electrical Stimulation Goals Strength      Manual Therapy   Manual therapy comments Lt knee PROM flexion and extension, gentle mobs into flexion and extension                    PT Short Term Goals - 08/12/20 0958      PT SHORT TERM GOAL #1   Title independent with initial HEP    Baseline was not compiant, and revised today    Status On-going    Target Date 08/01/20             PT Long  Term Goals - 08/12/20 0958      PT LONG TERM GOAL #1   Title She will be I and compliant with final HEP.    Time 12    Period Weeks    Status New    Target Date 11/04/20      PT LONG TERM GOAL #2   Title Lt knee AROM 0-130 for WNL knee ROM to improve function    Time 12    Period Weeks    Status New    Target Date 11/04/20      PT LONG TERM GOAL #3   Title report pain < 3/10 with amb for improved mobility    Time 12    Period Weeks    Status New    Target Date 11/04/20      PT LONG TERM GOAL #4   Title She will improve Lt hip/knee strength to 5/5 MMT to improve function    Time 12    Period Weeks    Status New      PT LONG TERM GOAL #5   Title She will able to ambulate community distances 1,000 feet WNL gait pattern independently and on even/uneven surfaces and stairs.    Time 12    Status New                 Plan - 08/26/20 1127    Clinical Impression Statement She has made progress since last visit with ROM, quad activivation, and weight bearing tolerance. She was made great improvment in extension ROM but still with severe deficits in flexion ROM, hip/knee strength, and continues to need crutches for ambulation. We will continue to progress this as tolreated. She did show improved activity tolerance today within session.    Personal Factors and Comorbidities Comorbidity 1    Comorbidities pituitary tumor s/p VP shunt    Examination-Activity Limitations Bed Mobility;Bend;Squat;Stairs;Stand;Locomotion Level;Transfers    Examination-Participation Restrictions Community Activity;Occupation;Driving    Stability/Clinical Decision Making Stable/Uncomplicated    Rehab Potential Good    PT Frequency 2x / week   1-2x/wk   PT Duration 6 weeks    PT Treatment/Interventions ADLs/Self Care Home Management;Cryotherapy;Electrical Stimulation;Moist Heat;Balance training;Therapeutic exercise;Therapeutic activities;Functional mobility training;Stair training;Gait  training;Patient/family education;DME Instruction;Neuromuscular re-education;Manual techniques;Vasopneumatic Device;Taping;Dry needling;Passive range of motion    PT Next Visit Plan manual for ROM, get into weight bearing as tolerated, NMES for quad activation, BFR    PT Home Exercise Plan Access Code: [email or web address]    Consulted and Agree with Plan of Care Patient           Patient will benefit from skilled therapeutic intervention in order to improve the following deficits and impairments:  Abnormal gait,Pain,Decreased strength,Decreased mobility,Difficulty walking,Decreased balance,Decreased range of motion  Visit Diagnosis: Acute pain of left knee  Stiffness of left knee, not elsewhere classified  Other abnormalities of gait and mobility  Unsteadiness on feet     Problem List Patient Active Problem List   Diagnosis Date Noted  . Left anterior cruciate ligament tear   . Acute medial meniscal tear, left, sequela   . SIRS (systemic inflammatory response syndrome) ([place]) 02/01/2019  . Allergic reaction 02/01/2019  . 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 [place], [place], [address] Phone: [phone]   Fax:  [phone]  Name: Paula Haley MRN: [id number] Date of Birth: 12-Oct-2000

## 2020-08-27 IMAGING — CT CT HEAD W/O CM
3 series · 15 of 47 positions shown, 18 images · non-contrast
Comparison: None.

CLINICAL DATA: Altered level consciousness. History of VP shunt
post removal

EXAM:
CT HEAD WITHOUT CONTRAST
TECHNIQUE: Contiguous axial images were obtained from the base of the skull
through the vertex without intravenous contrast.

[Series 2: head wo · axial · 0.43mm/px · z∈[-158,-28]mm · 9 of 32 slices shown, 12 images]
[im 3/32  brain]
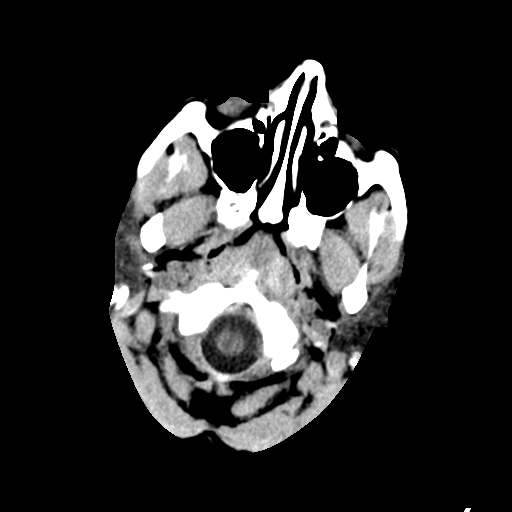
[im 3/32  bone]
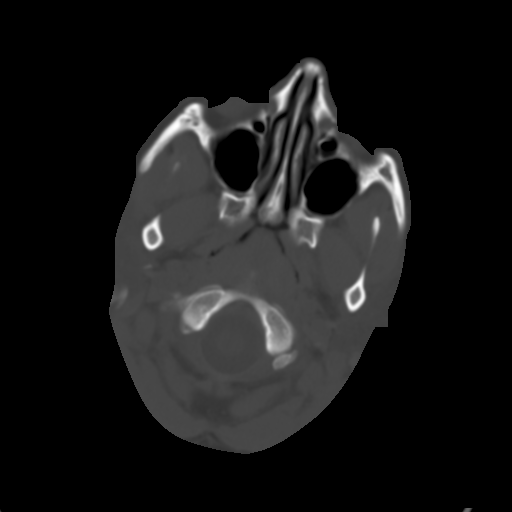
[im 6/32  brain]
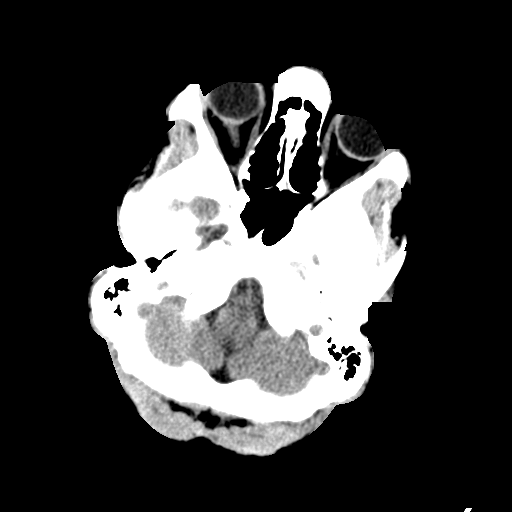
[im 9/32  brain]
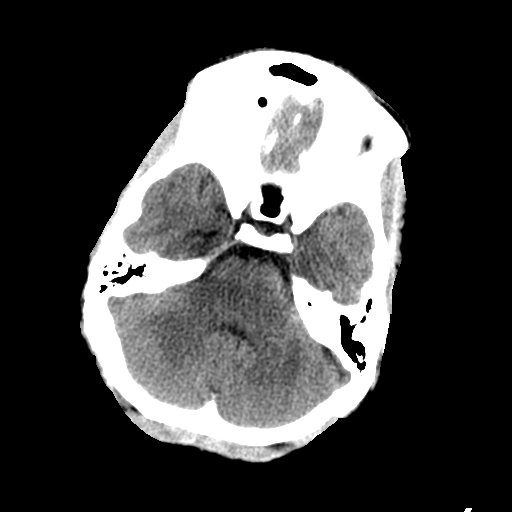
[im 12/32  brain]
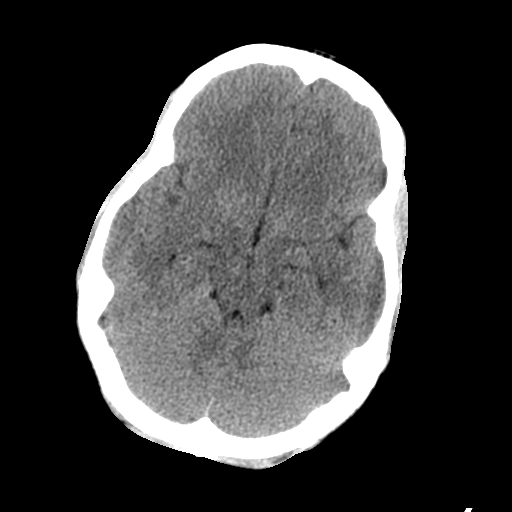
[im 17/32  brain]
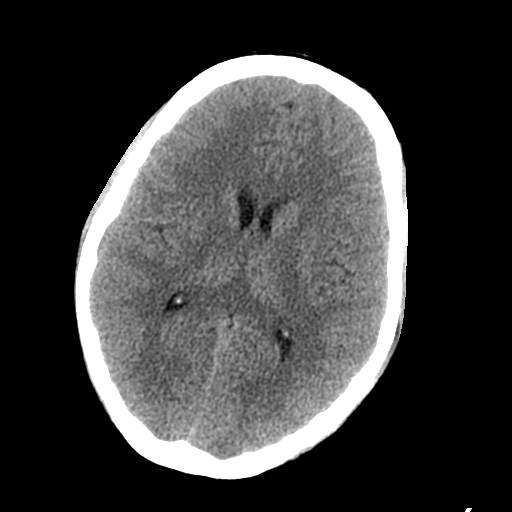
[im 17/32  bone]
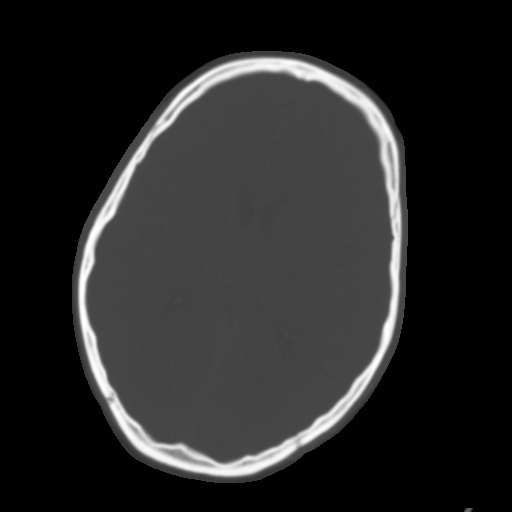
[im 20/32  brain]
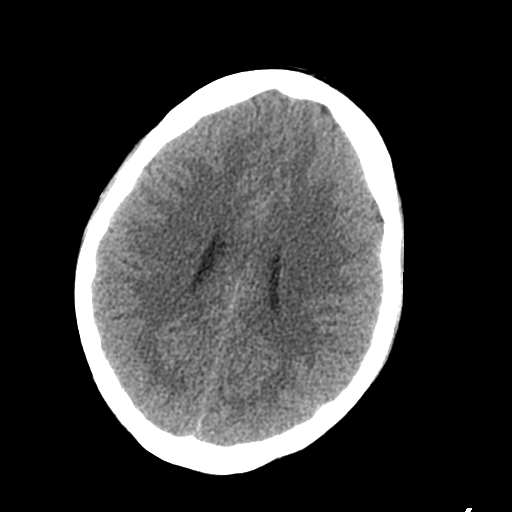
[im 23/32  brain]
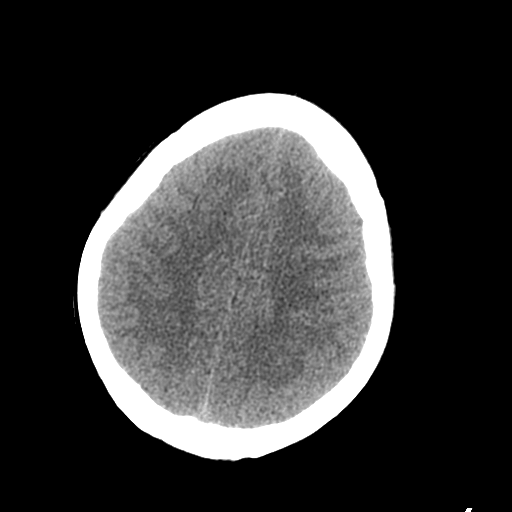
[im 26/32  brain]
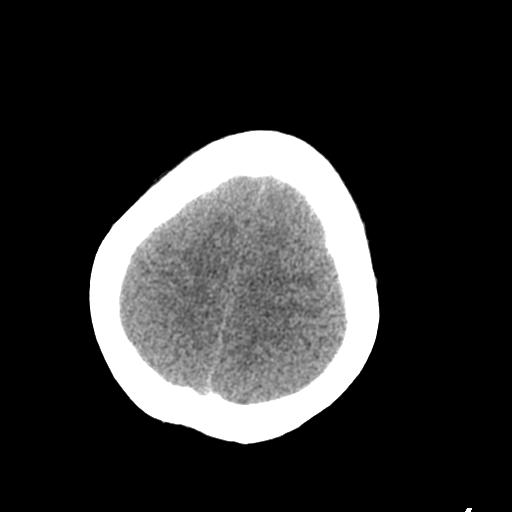
[im 29/32  brain]
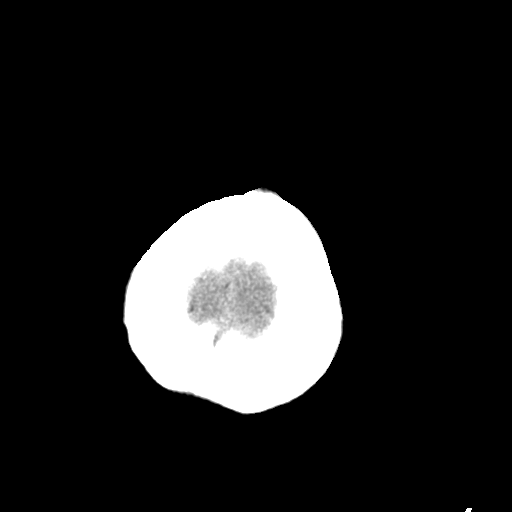
[im 29/32  bone]
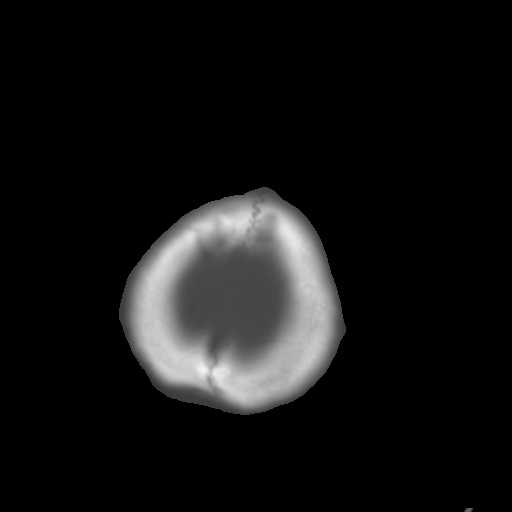

[Series 5: coronal soft tissue · coronal · 0.33mm/px · 3 of 72 slices shown]
[im 24/72  brain]
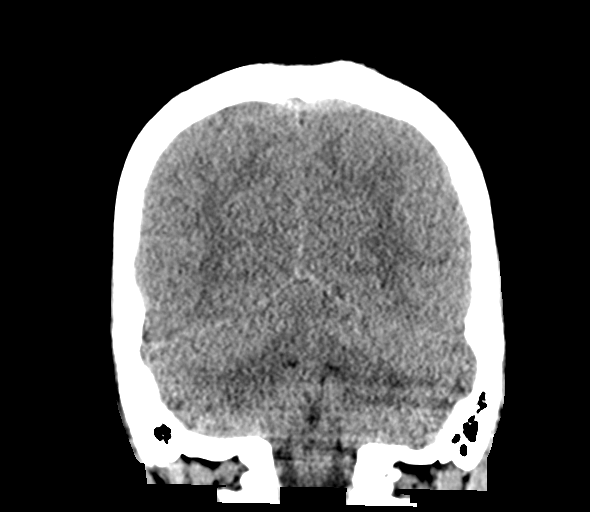
[im 32/72  brain]
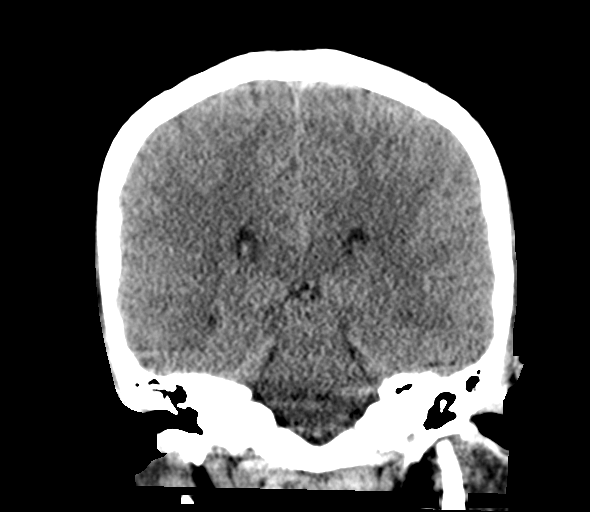
[im 40/72  brain]
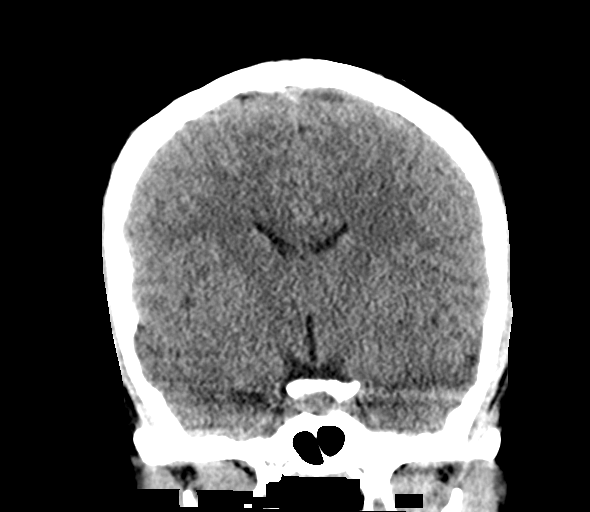

[Series 6: sagittal soft tissue · sagittal · 0.34mm/px · 3 of 57 slices shown]
[im 19/57  brain]
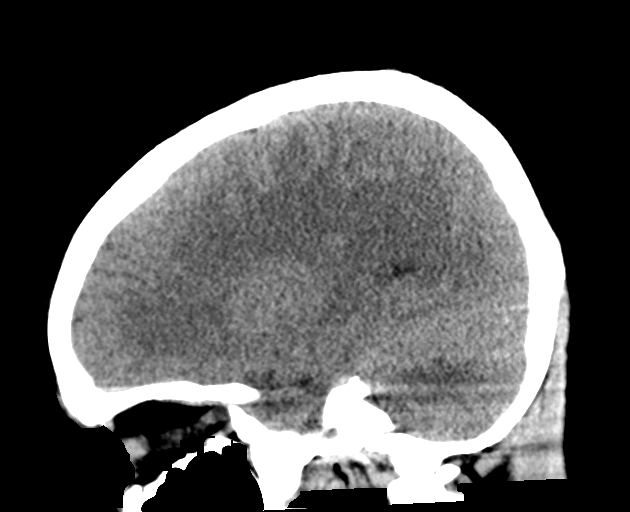
[im 29/57  brain]
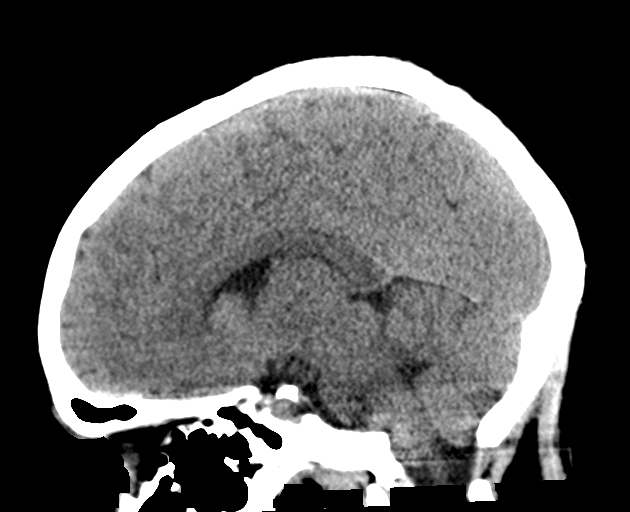
[im 38/57  brain]
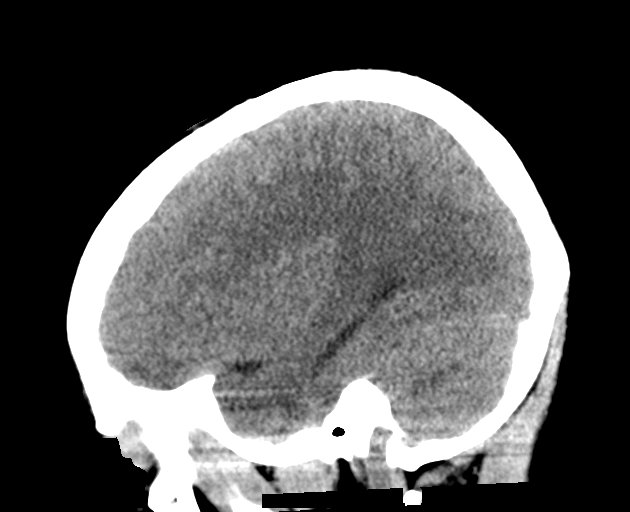

[15 of 47 positions shown; findings below may reference images not displayed]

FINDINGS: Brain: No intracranial hemorrhage, mass effect, or midline shift. No
hydrocephalus. No visualized white matter tract to suggest prior VP
shunt. The basilar cisterns are patent. No evidence of territorial
infarct or acute ischemia. No extra-axial or intracranial fluid
collection.

Vascular: No hyperdense vessel or unexpected calcification.

Skull: No fracture or focal lesion.

Sinuses/Orbits: Paranasal sinuses and mastoid air cells are clear.
The visualized orbits are unremarkable.

Other: Scalp soft tissues are unremarkable.
IMPRESSION: No acute intracranial abnormality.

## 2020-08-30 ENCOUNTER — Encounter: Payer: No Typology Code available for payment source | Admitting: Rehabilitative and Restorative Service Providers"

## 2020-08-30 ENCOUNTER — Telehealth: Payer: Self-pay | Admitting: Rehabilitative and Restorative Service Providers"

## 2020-08-30 ENCOUNTER — Other Ambulatory Visit: Payer: Self-pay

## 2020-08-30 ENCOUNTER — Encounter: Payer: Self-pay | Admitting: Physical Therapy

## 2020-08-30 NOTE — Telephone Encounter (Signed)
Called patient after 15 mins no show for appointment.  Phone rang and rang with no answer and no ability to leave message.  Attempts previously were also performed to schedule additional visits per POC and MD referral.  Pt. Has only attended 2 visits at this time overall and has no more visits scheduled.   Scot Jun, PT, DPT, OCS, ATC 08/30/20  9:02 AM

## 2020-09-01 ENCOUNTER — Encounter: Payer: No Typology Code available for payment source | Admitting: Physical Therapy

## 2020-09-01 ENCOUNTER — Encounter: Payer: Self-pay | Admitting: Physical Therapy

## 2020-09-01 MED ORDER — HYDROCODONE-ACETAMINOPHEN 5-325 MG PO TABS
1.0000 | ORAL_TABLET | Freq: Two times a day (BID) | ORAL | 0 refills | Status: DC | PRN
Start: 2020-09-01 — End: 2020-09-09

## 2020-09-05 ENCOUNTER — Encounter: Payer: Self-pay | Admitting: Rehabilitative and Restorative Service Providers"

## 2020-09-05 ENCOUNTER — Other Ambulatory Visit: Payer: Self-pay

## 2020-09-05 ENCOUNTER — Ambulatory Visit (INDEPENDENT_AMBULATORY_CARE_PROVIDER_SITE_OTHER): Payer: No Typology Code available for payment source | Admitting: Rehabilitative and Restorative Service Providers"

## 2020-09-05 DIAGNOSIS — R2689 Other abnormalities of gait and mobility: Secondary | ICD-10-CM

## 2020-09-05 DIAGNOSIS — M25662 Stiffness of left knee, not elsewhere classified: Secondary | ICD-10-CM | POA: Diagnosis not present

## 2020-09-05 DIAGNOSIS — R2681 Unsteadiness on feet: Secondary | ICD-10-CM

## 2020-09-05 DIAGNOSIS — M25562 Pain in left knee: Secondary | ICD-10-CM

## 2020-09-05 NOTE — Therapy (Addendum)
[place], [place], [address] Phone: [phone]   Fax:  [phone]  Physical Therapy Treatment  Patient Details  Name: Paula Haley MRN: [id number] Date of Birth: 2001-02-09 Referring Provider (PT): Dr. Marlou Sa   Encounter Date: 09/05/2020   PT End of Session - 09/05/20 1049    Visit Number 4    Number of Visits 15    Date for PT Re-Evaluation 11/22/20    Authorization Type [facility] -    Authorization Time Period 05/25/20-11/22/20    Authorization - Visit Number 4    Authorization - Number of Visits 15    Progress Note Due on Visit 10    PT Start Time 1050    PT Stop Time 1130    PT Time Calculation (min) 40 min    Activity Tolerance Patient tolerated treatment well    Behavior During Therapy [facility] for tasks assessed/performed           Past Medical History:  Diagnosis Date  . Anxiety   . Dizziness   . Headache   . History of COVID-19 05/09/2020  . Hydrocephalus ([place])   . Pituitary tumor   . Pseudotumor cerebri     Past Surgical History:  Procedure Laterality Date  . ANTERIOR CRUCIATE LIGAMENT REPAIR Left 07/18/2020   Procedure: left knee ACL reconstruction,medial  meniscal repair, debridement, QUADRICEP AUTOGRAFT;  Surgeon: Meredith Pel, MD;  Location: [facility];  Service: Orthopedics;  Laterality: Left;  Marland Kitchen VENTRICULOPERITONEAL SHUNT  2020    There were no vitals filed for this visit.   Subjective Assessment - 09/05/20 1052    Subjective Pt. stated pain c trying to straighten knee, pain noted anterior (7/10  at worst).  Walking independently, no brace (antalgic gait noted).    Limitations Standing;Walking    Patient Stated Goals have surgery, be able to run/jump again    Currently in Pain? No/denies    Pain Score 0-No pain   pain at worst 7/10   Pain Location Knee    Pain Orientation Left    Pain Descriptors / Indicators Aching;Tightness;Throbbing    Pain Type Surgical pain    Pain  Onset More than a month ago    Pain Frequency Intermittent    Aggravating Factors  end range    Pain Relieving Factors rest               [facility] PT Assessment - 09/05/20 0001      Assessment   Medical Diagnosis Lt knee ACL repair with medial meniscus repair 3/14    Referring Provider (PT) Dr. Marlou Sa    Onset Date/Surgical Date 07/18/20    Hand Dominance Right      AROM   Left Knee Extension -2   in supine quad set (-10 in LAQ seated)   Left Knee Flexion 70   in supine heel slide     PROM   Left Knee Extension 0    Left Knee Flexion 75   heel slide c overpressure supine     Strength   Right Knee Flexion 5/5   52.8, 52.1 lbs   Right Knee Extension 5/5   67.7, 63.7 lbs   Left Knee Flexion 4/5   23.8, 21.8 lbs   Left Knee Extension 3+/5   11.5, 14.5     Ambulation/Gait   Gait Pattern Decreased step length - right;Decreased stance time - left;Decreased stride length;Decreased weight shift to left;Left hip hike;Left circumduction;Left flexed knee in stance;Antalgic  Gait Comments Independent ambulation c antalgic gait                         [facility] Adult PT Treatment/Exercise - 09/05/20 0001      Exercises   Other Exercises  Extended time spent on education/review of existing HEP c cues for importance of routine use      Knee/Hip Exercises: Aerobic   Nustep Lvl 5 LE only 10 mins reciprocal ROM (no holds)      Knee/Hip Exercises: Seated   Long Arc Quad Left;10 reps    Other Seated Knee/Hip Exercises seated alternating Lt knee extension, flexion isometric to tolerance 5 sec hold x 15 each way      Knee/Hip Exercises: Supine   Other Supine Knee/Hip Exercises supine heel slide c SLR alternating 2 x 10      Knee/Hip Exercises: Prone   Other Prone Exercises prone quad set 5 sec hold x 15      Manual Therapy   Manual therapy comments Seated Lt knee flexion, distraction, IR mobilization c movement.  Extension overpressure for measurement                   PT Education - 09/05/20 1130    Education Details HEP review, education on importance of consistent and frequent performance.  POC education and encouragement to attended visits.    Person(s) Educated Patient    Methods Explanation;Demonstration;Verbal cues;Handout    Comprehension Verbalized understanding;Returned demonstration            PT Short Term Goals - 09/05/20 1129      PT SHORT TERM GOAL #1   Title independent with initial HEP    Baseline was not compiant, and revised today    Status Not Met    Target Date 08/01/20             PT Long Term Goals - 09/05/20 1129      PT LONG TERM GOAL #1   Title She will be I and compliant with final HEP.    Time 12    Period Weeks    Status On-going      PT LONG TERM GOAL #2   Title Lt knee AROM 0-130 for WNL knee ROM to improve function    Time 12    Period Weeks    Status On-going      PT LONG TERM GOAL #3   Title report pain < 3/10 with amb for improved mobility    Time 12    Period Weeks    Status On-going      PT LONG TERM GOAL #4   Title She will improve Lt hip/knee strength to 5/5 MMT to improve function    Time 12    Period Weeks    Status On-going      PT LONG TERM GOAL #5   Title She will able to ambulate community distances 1,000 feet WNL gait pattern independently and on even/uneven surfaces and stairs.    Time 12    Status On-going                 Plan - 09/05/20 1106    Clinical Impression Statement Upon inquiry, Pt. indicated one exercise performance at home (HEP had multiple listed).  Presentation today indicated extension extension lag noted in LAQ (-10) and SLR (-5) with passive extension at 0 degrees.  Lt knee flexion mobility limited drastically at this time actively as  documented.  Extended time and review on importance of HEP use and consistent/frequency to gain mobility and improve activation of quad on Lt side.  Continued skilled PT services with improved adherence  to POC and HEP indicated.  Due to review of HEP and education, NMES/BFR progression held today.    Personal Factors and Comorbidities Comorbidity 1    Comorbidities pituitary tumor s/p VP shunt    Examination-Activity Limitations Bed Mobility;Bend;Squat;Stairs;Stand;Locomotion Level;Transfers    Examination-Participation Restrictions Community Activity;Occupation;Driving    Stability/Clinical Decision Making Stable/Uncomplicated    Rehab Potential Good    PT Frequency 2x / week   1-2x/wk   PT Duration 6 weeks    PT Treatment/Interventions ADLs/Self Care Home Management;Cryotherapy;Electrical Stimulation;Moist Heat;Balance training;Therapeutic exercise;Therapeutic activities;Functional mobility training;Stair training;Gait training;Patient/family education;DME Instruction;Neuromuscular re-education;Manual techniques;Vasopneumatic Device;Taping;Dry needling;Passive range of motion    PT Next Visit Plan introduce BFR in NWB (held today due to HEP review time), manual for flexion based gains, NMES may be appropriate.    PT Home Exercise Plan Access Code: [email or web address]    Consulted and Agree with Plan of Care Patient           Patient will benefit from skilled therapeutic intervention in order to improve the following deficits and impairments:  Abnormal gait,Pain,Decreased strength,Decreased mobility,Difficulty walking,Decreased balance,Decreased range of motion  Visit Diagnosis: Acute pain of left knee  Stiffness of left knee, not elsewhere classified  Other abnormalities of gait and mobility  Unsteadiness on feet     Problem List Patient Active Problem List   Diagnosis Date Noted  . Left anterior cruciate ligament tear   . Acute medial meniscal tear, left, sequela   . SIRS (systemic inflammatory response syndrome) ([place]) 02/01/2019  . Allergic reaction 02/01/2019  . 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

## 2020-09-05 NOTE — Patient Instructions (Signed)
Access Code: [email or web address] URL: [email or web address] Date: 09/05/2020 Prepared by: Scot Jun  Exercises Heel Prop - 2 x daily - 6 x weekly - 1 sets - 1 reps - 5 min hold Supine Quad Set on Towel Roll - 10-15 x daily - 7 x weekly - 1 sets - 5-10 reps - 5 sec hold Seated Knee Flexion Stretch - 5 x daily - 6 x weekly - 2 sets - 10 reps - 5 sec hold Supine Heel Slide with Strap - 10-15 x daily - 7 x weekly - 1 sets - 5-10 reps Sidelying Hip Abduction - 2 x daily - 7 x weekly - 1 sets - 10 reps Prone Hip Extension - 2 x daily - 7 x weekly - 1 sets - 10 reps Small Range Straight Leg Raise - 2 x daily - 7 x weekly - 10 reps - 3 sets Supine Heel Slide - 2 x daily - 7 x weekly - 3 sets - 10 reps - 2 hold

## 2020-09-07 ENCOUNTER — Encounter: Payer: Self-pay | Admitting: Orthopedic Surgery

## 2020-09-07 ENCOUNTER — Other Ambulatory Visit: Payer: Self-pay

## 2020-09-07 ENCOUNTER — Ambulatory Visit (INDEPENDENT_AMBULATORY_CARE_PROVIDER_SITE_OTHER): Payer: No Typology Code available for payment source | Admitting: Orthopedic Surgery

## 2020-09-07 DIAGNOSIS — S83512A Sprain of anterior cruciate ligament of left knee, initial encounter: Secondary | ICD-10-CM

## 2020-09-07 NOTE — Progress Notes (Signed)
Post-Op Visit Note   Patient: Paula Haley           Date of Birth: 2000-06-15           MRN: [id number] Visit Date: 09/07/2020 PCP: Clinic, [place]:  Chief Complaint:  Chief Complaint  Patient presents with  . Left Knee - Routine Post Op   Visit Diagnoses:  1. Complete tear of anterior cruciate ligament of left knee, initial encounter     Plan: Patient is a 20 year old female who presents s/p left knee anterior cruciate ligament reconstruction with quadricep autograft on 07/19/2020.  She reports that pain is improving and feels her knee is doing better.  She has been specifically working on her range of motion, particularly getting the leg straight.  She is in physical therapy and using a CPM machine.  Her pain is improved to the point where she does not have to take pain medication every 4 hours and now only has to take it about 2-3 times per day when she does her therapy exercises.  She has achieved almost full extension and extends to about 3 degrees.  Her flexion is somewhat improved compared with prior examination.  She was measured at 60 degrees but it seems like she reaches about 70 to 75 degrees of flexion today in clinic.  She does feel like she is hit a wall with her flexion and she would like to discuss manipulation as an option.  ACL graft is intact by Lachman exam.  Incisions are well-healed.  She has excellent quadricep strength and is able to perform multiple straight leg raises without difficulty.  No effusion present.  No calf tenderness.  Negative Homans' sign.  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  Discussed manipulation under anesthesia as well as the recovery time frame from this.  She understands that any increased knee flexion obtained will need to be maintained by persistent use of CPM machine and therapy exercises.  She understands there is a potential risk  for injury to the ACL graft but wishes to proceed regardless. Follow-up after procedure  Follow-Up Instructions: No follow-ups on file.   Orders:  No orders of the defined types were placed in this encounter.  No orders of the defined types were placed in this encounter.   Imaging: No results found.  PMFS History: Patient Active Problem List   Diagnosis Date Noted  . Left anterior cruciate ligament tear   . Acute medial meniscal tear, left, sequela   . SIRS (systemic inflammatory response syndrome) ([place]) 02/01/2019  . Allergic reaction 02/01/2019  . 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 Anxiety   . Dizziness   . Headache   . History of COVID-19 05/09/2020  . Hydrocephalus ([place])   . Pituitary tumor   . Pseudotumor cerebri     Family History  Problem Relation Age of Onset  . Healthy Mother   . Healthy Father     Past Surgical History:  Procedure Laterality Date  . ANTERIOR CRUCIATE LIGAMENT REPAIR Left 07/18/2020   Procedure: left knee ACL reconstruction,medial  meniscal repair, debridement, QUADRICEP AUTOGRAFT;  Surgeon: Meredith Pel, MD;  Location: [facility];  Service: Orthopedics;  Laterality: Left;  Marland Kitchen VENTRICULOPERITONEAL SHUNT  2020   Social History   Occupational History  . Not on file  Tobacco  Use  . Smoking status: Former Smoker    Types: Cigarettes  . Smokeless tobacco: Never Used  . Tobacco comment: Occasional cigarette smoker  Vaping Use  . Vaping Use: Never used  Substance and Sexual Activity  . Alcohol use: Never  . Drug use: Yes    Types: Marijuana    Comment: former user  . Sexual activity: Yes    Birth control/protection: None    Comment: sexual intercourse with women only

## 2020-09-08 ENCOUNTER — Other Ambulatory Visit (HOSPITAL_COMMUNITY)
Admission: RE | Admit: 2020-09-08 | Discharge: 2020-09-08 | Disposition: A | Discharge status: Home / Self Care | Payer: No Typology Code available for payment source | Source: Ambulatory Visit | Attending: Orthopedic Surgery | Admitting: Orthopedic Surgery

## 2020-09-08 ENCOUNTER — Encounter (HOSPITAL_COMMUNITY): Payer: Self-pay | Admitting: Orthopedic Surgery

## 2020-09-08 DIAGNOSIS — Z01812 Encounter for preprocedural laboratory examination: Secondary | ICD-10-CM | POA: Insufficient documentation

## 2020-09-08 DIAGNOSIS — Z20822 Contact with and (suspected) exposure to covid-19: Secondary | ICD-10-CM | POA: Insufficient documentation

## 2020-09-08 NOTE — Anesthesia Preprocedure Evaluation (Addendum)
Anesthesia Evaluation  Patient identified by MRN, date of birth, ID band Patient awake    Reviewed: Allergy & Precautions, NPO status   Airway Mallampati: II  TM Distance: >3 FB     Dental   Pulmonary Patient abstained from smoking., former smoker,    breath sounds clear to auscultation       Cardiovascular negative cardio ROS   Rhythm:Regular Rate:Normal     Neuro/Psych    GI/Hepatic negative GI ROS, Neg liver ROS,   Endo/Other  negative endocrine ROS  Renal/GU negative Renal ROS     Musculoskeletal   Abdominal   Peds  Hematology   Anesthesia Other Findings   Reproductive/Obstetrics                            Anesthesia Physical Anesthesia Plan  ASA: III  Anesthesia Plan: General   Post-op Pain Management:    Induction: Intravenous  PONV Risk Score and Plan: 3 and Ondansetron, Dexamethasone and Midazolam  Airway Management Planned: LMA  Additional Equipment:   Intra-op Plan:   Post-operative Plan: Extubation in OR  Informed Consent: I have reviewed the patients History and Physical, chart, labs and discussed the procedure including the risks, benefits and alternatives for the proposed anesthesia with the patient or authorized representative who has indicated his/her understanding and acceptance.     Dental advisory given  Plan Discussed with: CRNA and Anesthesiologist  Anesthesia Plan Comments:        Anesthesia Quick Evaluation

## 2020-09-08 NOTE — Progress Notes (Signed)
[facility] [place] [facility]  denies  PPM/ICD - denies   Chest x-ray - 11/2019 EKG - 01/25/19 Stress Test - denies ECHO - denies Cardiac Cath - denies  Sleep Study - denies    ERAS Protcol - clears until 0430   COVID TEST- 09/08/20 - pending result   Anesthesia review: no  Patient denies shortness of breath, fever, cough and chest pain at PAT appointment   All instructions explained to the patient, with a verbal understanding of the material. Patient also instructed to self quarantine after being tested for COVID-19. The opportunity to ask questions was provided.

## 2020-09-09 ENCOUNTER — Encounter (HOSPITAL_COMMUNITY)
Admission: RE | Disposition: A | Discharge status: Home / Self Care | Payer: Self-pay | Source: Home / Self Care | Attending: Orthopedic Surgery

## 2020-09-09 ENCOUNTER — Ambulatory Visit (HOSPITAL_COMMUNITY)
Admission: RE | Admit: 2020-09-09 | Discharge: 2020-09-09 | Disposition: A | Discharge status: Home / Self Care | Payer: No Typology Code available for payment source | Attending: Orthopedic Surgery | Admitting: Orthopedic Surgery

## 2020-09-09 ENCOUNTER — Encounter (HOSPITAL_COMMUNITY): Payer: Self-pay | Admitting: Orthopedic Surgery

## 2020-09-09 ENCOUNTER — Ambulatory Visit (HOSPITAL_COMMUNITY): Payer: No Typology Code available for payment source | Admitting: Anesthesiology

## 2020-09-09 DIAGNOSIS — Z982 Presence of cerebrospinal fluid drainage device: Secondary | ICD-10-CM | POA: Insufficient documentation

## 2020-09-09 DIAGNOSIS — Z9889 Other specified postprocedural states: Secondary | ICD-10-CM | POA: Insufficient documentation

## 2020-09-09 DIAGNOSIS — Z8616 Personal history of COVID-19: Secondary | ICD-10-CM | POA: Diagnosis not present

## 2020-09-09 DIAGNOSIS — Z87891 Personal history of nicotine dependence: Secondary | ICD-10-CM | POA: Diagnosis not present

## 2020-09-09 DIAGNOSIS — M24662 Ankylosis, left knee: Secondary | ICD-10-CM

## 2020-09-09 DIAGNOSIS — Z880 Allergy status to penicillin: Secondary | ICD-10-CM | POA: Diagnosis not present

## 2020-09-09 DIAGNOSIS — Z886 Allergy status to analgesic agent status: Secondary | ICD-10-CM | POA: Insufficient documentation

## 2020-09-09 HISTORY — PX: SHOULDER CLOSED REDUCTION: SHX1051

## 2020-09-09 LAB — POCT PREGNANCY, URINE: Preg Test, Ur: NEGATIVE

## 2020-09-09 LAB — SARS CORONAVIRUS 2 (TAT 6-24 HRS): SARS Coronavirus 2: NEGATIVE

## 2020-09-09 SURGERY — MANIPULATION, JOINT, SHOULDER, WITH ANESTHESIA
Anesthesia: General | Site: Shoulder | Laterality: Left

## 2020-09-09 MED ORDER — FENTANYL CITRATE (PF) 250 MCG/5ML IJ SOLN
INTRAMUSCULAR | Status: AC
  Filled 2020-09-09: qty 5

## 2020-09-09 MED ORDER — POVIDONE-IODINE 7.5 % EX SOLN: Freq: Once | CUTANEOUS | Status: DC

## 2020-09-09 MED ORDER — ORAL CARE MOUTH RINSE
15.0000 mL | Freq: Once | OROMUCOSAL | Status: AC
Start: 2020-09-09 — End: 2020-09-09

## 2020-09-09 MED ORDER — MORPHINE SULFATE (PF) 4 MG/ML IV SOLN
INTRAVENOUS | Status: DC | PRN
  Administered 2020-09-09: 4 mg via INTRAVENOUS

## 2020-09-09 MED ORDER — CHLORHEXIDINE GLUCONATE 0.12 % MT SOLN
15.0000 mL | Freq: Once | OROMUCOSAL | Status: AC
Start: 2020-09-09 — End: 2020-09-09
  Administered 2020-09-09: 15 mL via OROMUCOSAL
  Filled 2020-09-09: qty 15

## 2020-09-09 MED ORDER — DEXAMETHASONE SODIUM PHOSPHATE 10 MG/ML IJ SOLN
8.0000 mg | Freq: Once | INTRAMUSCULAR | Status: AC
  Administered 2020-09-09: 8 mg via INTRAVENOUS
  Filled 2020-09-09: qty 1

## 2020-09-09 MED ORDER — HYDROCODONE-ACETAMINOPHEN 5-325 MG PO TABS: 1.0000 | ORAL_TABLET | Freq: Three times a day (TID) | ORAL | 0 refills | Status: DC | PRN

## 2020-09-09 MED ORDER — POVIDONE-IODINE 10 % EX SWAB: 2.0000 "application " | Freq: Once | CUTANEOUS | Status: DC

## 2020-09-09 MED ORDER — FENTANYL CITRATE (PF) 100 MCG/2ML IJ SOLN: 25.0000 ug | INTRAMUSCULAR | Status: DC | PRN

## 2020-09-09 MED ORDER — CLONIDINE HCL (ANALGESIA) 100 MCG/ML EP SOLN
EPIDURAL | Status: AC
  Filled 2020-09-09: qty 10

## 2020-09-09 MED ORDER — PROPOFOL 10 MG/ML IV BOLUS
INTRAVENOUS | Status: AC
  Filled 2020-09-09: qty 40

## 2020-09-09 MED ORDER — ONDANSETRON HCL 4 MG/2ML IJ SOLN
INTRAMUSCULAR | Status: AC
  Filled 2020-09-09: qty 2

## 2020-09-09 MED ORDER — BUPIVACAINE-EPINEPHRINE 0.25% -1:200000 IJ SOLN
INTRAMUSCULAR | Status: DC | PRN
  Administered 2020-09-09: 30 mL

## 2020-09-09 MED ORDER — LIDOCAINE 2% (20 MG/ML) 5 ML SYRINGE
INTRAMUSCULAR | Status: DC | PRN
  Administered 2020-09-09: 60 mg via INTRAVENOUS

## 2020-09-09 MED ORDER — ONDANSETRON HCL 4 MG/2ML IJ SOLN
INTRAMUSCULAR | Status: DC | PRN
  Administered 2020-09-09: 4 mg via INTRAVENOUS

## 2020-09-09 MED ORDER — VANCOMYCIN HCL IN DEXTROSE 1-5 GM/200ML-% IV SOLN
1000.0000 mg | INTRAVENOUS | Status: AC
  Administered 2020-09-09: 1000 mg via INTRAVENOUS
  Filled 2020-09-09: qty 200

## 2020-09-09 MED ORDER — PROPOFOL 10 MG/ML IV BOLUS
INTRAVENOUS | Status: DC | PRN
  Administered 2020-09-09: 100 mg via INTRAVENOUS
  Administered 2020-09-09: 200 mg via INTRAVENOUS

## 2020-09-09 MED ORDER — MIDAZOLAM HCL 2 MG/2ML IJ SOLN
INTRAMUSCULAR | Status: AC
  Filled 2020-09-09: qty 2

## 2020-09-09 MED ORDER — DIPHENHYDRAMINE HCL 50 MG/ML IJ SOLN
6.2500 mg | Freq: Once | INTRAMUSCULAR | Status: AC
  Administered 2020-09-09: 6.5 mg via INTRAVENOUS

## 2020-09-09 MED ORDER — HYDROCODONE-ACETAMINOPHEN 5-325 MG PO TABS
ORAL_TABLET | ORAL | Status: AC
  Filled 2020-09-09: qty 1

## 2020-09-09 MED ORDER — MORPHINE SULFATE (PF) 4 MG/ML IV SOLN
INTRAVENOUS | Status: AC
  Filled 2020-09-09: qty 2

## 2020-09-09 MED ORDER — HYDROCODONE-ACETAMINOPHEN 5-325 MG PO TABS
1.0000 | ORAL_TABLET | Freq: Once | ORAL | Status: AC
  Administered 2020-09-09: 1 via ORAL

## 2020-09-09 MED ORDER — LACTATED RINGERS IV SOLN: INTRAVENOUS | Status: DC

## 2020-09-09 MED ORDER — DIPHENHYDRAMINE HCL 50 MG/ML IJ SOLN
INTRAMUSCULAR | Status: AC
  Filled 2020-09-09: qty 1

## 2020-09-09 MED ORDER — LIDOCAINE 2% (20 MG/ML) 5 ML SYRINGE
INTRAMUSCULAR | Status: AC
  Filled 2020-09-09: qty 5

## 2020-09-09 MED ORDER — BUPIVACAINE-EPINEPHRINE (PF) 0.25% -1:200000 IJ SOLN
INTRAMUSCULAR | Status: AC
  Filled 2020-09-09: qty 30

## 2020-09-09 MED ORDER — KETOROLAC TROMETHAMINE 30 MG/ML IJ SOLN
INTRAMUSCULAR | Status: AC
  Filled 2020-09-09: qty 1

## 2020-09-09 MED ORDER — MIDAZOLAM HCL 2 MG/2ML IJ SOLN
INTRAMUSCULAR | Status: DC | PRN
  Administered 2020-09-09: 2 mg via INTRAVENOUS

## 2020-09-09 MED ORDER — CLONIDINE HCL (ANALGESIA) 100 MCG/ML EP SOLN
EPIDURAL | Status: DC | PRN
  Administered 2020-09-09: 1 mL

## 2020-09-09 MED ORDER — DEXAMETHASONE SODIUM PHOSPHATE 10 MG/ML IJ SOLN
INTRAMUSCULAR | Status: AC
  Filled 2020-09-09: qty 1

## 2020-09-09 SURGICAL SUPPLY — 20 items
BNDG ADH 1X3 SHEER STRL LF (GAUZE/BANDAGES/DRESSINGS) ×2 IMPLANT
BNDG ELASTIC 6X5.8 VLCR STR LF (GAUZE/BANDAGES/DRESSINGS) ×2 IMPLANT
CLOTH BEACON ORANGE TIMEOUT ST (SAFETY) ×2 IMPLANT
GAUZE SPONGE 4X4 12PLY STRL (GAUZE/BANDAGES/DRESSINGS) ×2 IMPLANT
GLOVE SURG LTX SZ8 (GLOVE) IMPLANT
GLOVE SURG UNDER LTX SZ8 (GLOVE) IMPLANT
KIT TURNOVER CYSTO (KITS) ×2 IMPLANT
MANIFOLD NEPTUNE II (INSTRUMENTS) IMPLANT
NDL SAFETY ECLIPSE 18X1.5 (NEEDLE) ×1 IMPLANT
NEEDLE HYPO 18GX1.5 SHARP (NEEDLE) ×1
NEEDLE SPNL 22GX3.5 QUINCKE BK (NEEDLE) IMPLANT
PAD ALCOHOL SWAB (MISCELLANEOUS) IMPLANT
SLING ARM FOAM STRAP LRG (SOFTGOODS) IMPLANT
SLING ARM FOAM STRAP MED (SOFTGOODS) IMPLANT
SWABSTICK PVP IODINE (MISCELLANEOUS) IMPLANT
SYR 20ML LL LF (SYRINGE) IMPLANT
SYR 30ML LL (SYRINGE) ×2 IMPLANT
SYR CONTROL 10ML LL (SYRINGE) ×2 IMPLANT
TRAY DSU PREP LF (CUSTOM PROCEDURE TRAY) ×2 IMPLANT
TUBE CONNECTING 12X1/4 (SUCTIONS) IMPLANT

## 2020-09-09 NOTE — Progress Notes (Addendum)
Patient complaining of itching after receiving 1/2 dose of vanc.  No rash present.  Vancomycin stopped and line flushed.   Dr. Nyoka Cowden notified and received verbal order to give 6.25 mg of Benadryl.  Dr. Marlou Sa notified and stated that patient does not need to complete antibiotic.     Update:  Patient states that itching has resolved.  CRNA at bedside.

## 2020-09-09 NOTE — Brief Op Note (Signed)
   09/09/2020  7:43 AM  PATIENT:  Paula Haley  20 y.o. female  PRE-OPERATIVE DIAGNOSIS:  left knee arthrofibrosis  POST-OPERATIVE DIAGNOSIS:  left knee arthrofibrosis  PROCEDURE:  Procedure(s): LEFT KNEE MANIPULATION UNDER ANESTHESIA  SURGEON:  Surgeon(s): Meredith Pel, MD  ASSISTANT: magnant pa  ANESTHESIA:   general  EBL: 0 ml    No intake/output data recorded.  BLOOD ADMINISTERED: none  DRAINS: none   LOCAL MEDICATIONS USED:  Marcaine mso4 clonidine  SPECIMEN:  No Specimen  COUNTS:  YES  TOURNIQUET:  * No tourniquets in log *  DICTATION: .Other Dictation: Dictation Number [id number]  PLAN OF CARE: Discharge to home after PACU  PATIENT DISPOSITION:  PACU - hemodynamically stable

## 2020-09-09 NOTE — Anesthesia Procedure Notes (Signed)
Procedure Name: LMA Insertion Date/Time: 09/09/2020 7:31 AM Performed by: Reece Agar, CRNA Pre-anesthesia Checklist: Patient identified, Emergency Drugs available, Suction available and Patient being monitored Patient Re-evaluated:Patient Re-evaluated prior to induction Oxygen Delivery Method: Circle System Utilized Preoxygenation: Pre-oxygenation with 100% oxygen Induction Type: IV induction Ventilation: Mask ventilation without difficulty LMA: LMA inserted LMA Size: 4.0 Number of attempts: 1 Airway Equipment and Method: Bite block Placement Confirmation: positive ETCO2 Tube secured with: Tape Dental Injury: Teeth and Oropharynx as per pre-operative assessment

## 2020-09-09 NOTE — H&P (Signed)
Paula Haley is an 20 y.o. female.   Chief Complaint: Left knee stiffness HPI: Patient underwent ACL reconstruction approximately 6 weeks ago.  She has had difficulty achieving meaningful flexion.  She has plateaued in physical therapy.  Presents now for manipulation under anesthesia.  Past Medical History:  Diagnosis Date  . Anxiety   . Dizziness   . Headache   . History of COVID-19 05/09/2020  . Hydrocephalus ([place])   . Pituitary tumor   . Pseudotumor cerebri     Past Surgical History:  Procedure Laterality Date  . ANTERIOR CRUCIATE LIGAMENT REPAIR Left 07/18/2020   Procedure: left knee ACL reconstruction,medial  meniscal repair, debridement, QUADRICEP AUTOGRAFT;  Surgeon: Meredith Pel, MD;  Location: [facility];  Service: Orthopedics;  Laterality: Left;  Marland Kitchen VENTRICULOPERITONEAL SHUNT  2020    Family History  Problem Relation Age of Onset  . Healthy Mother   . Healthy Father    Social History:  reports that she has quit smoking. Her smoking use included cigarettes. She has never used smokeless tobacco. She reports current drug use. Drug: Marijuana. She reports that she does not drink alcohol.  Allergies:  Allergies  Allergen Reactions  . Naproxen Hives  . Penicillins Hives    Medications Prior to Admission  Medication Sig Dispense Refill  . cholecalciferol (VITAMIN D3) 25 MCG (1000 UNIT) tablet Take 1,000 Units by mouth in the morning.    Marland Kitchen HYDROcodone-acetaminophen (NORCO/VICODIN) 5-325 MG tablet Take 1 tablet by mouth every 12 (twelve) hours as needed for moderate pain. 25 tablet 0  . celecoxib (CELEBREX) 100 MG capsule Take 1 capsule (100 mg total) by mouth 2 (two) times daily. (Patient not taking: Reported on 09/07/2020) 60 capsule 0  . gabapentin (NEURONTIN) 300 MG capsule Take 1 capsule (300 mg total) by mouth 3 (three) times daily. (Patient not taking: Reported on 09/07/2020) 60 capsule 0  . methocarbamol (ROBAXIN) 500 MG tablet TAKE 1 TABLET(500  MG) BY MOUTH EVERY 8 HOURS AS NEEDED (Patient not taking: Reported on 09/07/2020) 30 tablet 0    Results for orders placed or performed during the hospital encounter of 09/09/20 (from the past 48 hour(s))  Pregnancy, urine POC     Status: None   Collection Time: 09/09/20  6:04 AM  Result Value Ref Range   Preg Test, Ur NEGATIVE NEGATIVE    Comment:        THE SENSITIVITY OF THIS METHODOLOGY IS >24 mIU/mL    No results found.  Review of Systems  Musculoskeletal: Positive for arthralgias.  All other systems reviewed and are negative.   Blood pressure 130/86, pulse 75, temperature 97.9 F (36.6 C), temperature source Oral, resp. rate 18, height 5\' 7"  (1.702 m), weight 68 kg, last menstrual period 08/31/2020, SpO2 98 %. Physical Exam Vitals reviewed.  HENT:     Head: Normocephalic.     Right Ear: Tympanic membrane normal.     Nose: Nose normal.     Mouth/Throat:     Mouth: Mucous membranes are moist.  Eyes:     Pupils: Pupils are equal, round, and reactive to light.  Cardiovascular:     Rate and Rhythm: Normal rate.     Pulses: Normal pulses.  Pulmonary:     Effort: Pulmonary effort is normal.  Abdominal:     General: Abdomen is flat.  Musculoskeletal:     Cervical back: Normal range of motion.  Skin:    General: Skin is warm.     Capillary  Refill: Capillary refill takes less than 2 seconds.  Neurological:     General: No focal deficit present.     Mental Status: She is alert.  Psychiatric:        Mood and Affect: Mood normal.   Left knee range of motion is 0 to 70 degrees.  Fairly firm endpoint.  Collaterals are stable.  Graft is stable.  No effusion present.  Assessment/Plan Impression is left knee arthrofibrosis.  Plan is left knee manipulation under anesthesia with intra-articular pain medicine.  Risk and benefits are discussed include not limited to infection nerve vessel damage fracture graft rupture.  Patient understands risk benefits and wishes to proceed.  She  has a CPM machine at home which she can use.  All questions answered.  Anderson Malta, MD 09/09/2020, 7:01 AM

## 2020-09-09 NOTE — Anesthesia Postprocedure Evaluation (Signed)
Anesthesia Post Note  Patient: Paula Haley  Procedure(s) Performed: LEFT KNEE MANIPULATION UNDER ANESTHESIA (Left Shoulder)     Patient location during evaluation: PACU Anesthesia Type: General Level of consciousness: awake Pain management: pain level controlled Vital Signs Assessment: post-procedure vital signs reviewed and stable Cardiovascular status: stable Postop Assessment: no apparent nausea or vomiting Anesthetic complications: no   No complications documented.  Last Vitals:  Vitals:   09/09/20 0815 09/09/20 0830  BP: (!) 110/57 106/68  Pulse: 88 72  Resp: 19 17  Temp:  36.5 C  SpO2: 100% 99%    Last Pain:  Vitals:   09/09/20 0800  TempSrc:   PainSc: Asleep                 Elver Stadler

## 2020-09-09 NOTE — Transfer of Care (Signed)
Immediate Anesthesia Transfer of Care Note  Patient: Paula Haley  Procedure(s) Performed: LEFT KNEE MANIPULATION UNDER ANESTHESIA (Left Shoulder)  Patient Location: PACU  Anesthesia Type:General  Level of Consciousness: drowsy  Airway & Oxygen Therapy: Patient Spontanous Breathing and Patient connected to face mask oxygen  Post-op Assessment: Report given to RN and Post -op Vital signs reviewed and stable  Post vital signs: Reviewed and stable  Last Vitals:  Vitals Value Taken Time  BP 112/62 09/09/20 0758  Temp    Pulse 77 09/09/20 0758  Resp 18 09/09/20 0758  SpO2 100 % 09/09/20 0758  Vitals shown include unvalidated device data.  Last Pain:  Vitals:   09/09/20 0615  TempSrc:   PainSc: 0-No pain      Patients Stated Pain Goal: 5 (00/86/76 1950)  Complications: No complications documented.

## 2020-09-10 ENCOUNTER — Encounter (HOSPITAL_COMMUNITY): Payer: Self-pay | Admitting: Orthopedic Surgery

## 2020-09-12 ENCOUNTER — Other Ambulatory Visit: Payer: Self-pay

## 2020-09-12 ENCOUNTER — Ambulatory Visit (INDEPENDENT_AMBULATORY_CARE_PROVIDER_SITE_OTHER): Payer: No Typology Code available for payment source | Admitting: Physical Therapy

## 2020-09-12 ENCOUNTER — Encounter: Payer: Self-pay | Admitting: Physical Therapy

## 2020-09-12 DIAGNOSIS — M25562 Pain in left knee: Secondary | ICD-10-CM

## 2020-09-12 DIAGNOSIS — R2689 Other abnormalities of gait and mobility: Secondary | ICD-10-CM

## 2020-09-12 DIAGNOSIS — M25662 Stiffness of left knee, not elsewhere classified: Secondary | ICD-10-CM

## 2020-09-12 DIAGNOSIS — R2681 Unsteadiness on feet: Secondary | ICD-10-CM

## 2020-09-12 NOTE — Op Note (Signed)
Paula Haley, BARDALES MEDICAL RECORD NO: [id number] ACCOUNT NO: [id number] DATE OF BIRTH: Dec 27, 2000 FACILITY: [facility] LOCATION: [facility] PHYSICIAN: Yetta Barre. Marlou Sa, MD  Operative Report   DATE OF PROCEDURE: 09/09/2020  PREOPERATIVE DIAGNOSIS:  Left knee arthrofibrosis.  POSTOPERATIVE DIAGNOSIS:  Left knee arthrofibrosis.  PROCEDURE:  Left knee manipulation under anesthesia.  SURGEON ATTENDING:  Yetta Barre. Marlou Sa, MD.  INDICATIONS:  The patient is a 20 year old patient with left knee arthrofibrosis following ACL reconstruction about 6 weeks ago and presents for operative management after plateauing and physical therapy and not being able to achieve flexion beyond 90  degrees.  DESCRIPTION OF PROCEDURE:  The patient was brought to the operating room.  Timeout was called.  Left knee was examined under anesthesia.  The graft was stable.  The patient had about 85 degrees of flexion.  She was manipulated into full flexion with some  breaking up of scar tissue.  Graft stable after manipulation.  Collateral stable after manipulation.  The patient now has full range of motion from about 5 degrees to 125 degrees of flexion.  Solution of Marcaine, morphine, clonidine injected into the  knee for postoperative pain relief after the manipulation into a sterilely prepped area.  The patient tolerated the procedure well.  She will start on CPM machine today.   PAA D: 09/09/2020 7:46:00 am T: 09/10/2020 1:19:00 am  JOB: [id number]/ [id number]

## 2020-09-12 NOTE — Therapy (Signed)
[place], [place], [address] Phone: [phone]   Fax:  [phone]  Physical Therapy Treatment  Patient Details  Name: Paula Haley MRN: [id number] Date of Birth: 26-Apr-2001 Referring Provider (PT): Dr. Marlou Sa   Encounter Date: 09/12/2020   PT End of Session - 09/12/20 1259    Visit Number 5    Number of Visits 15    Date for PT Re-Evaluation 11/22/20    Authorization Type VA -    Authorization Time Period 05/25/20-11/22/20    Authorization - Visit Number 5    Authorization - Number of Visits 15    Progress Note Due on Visit 10    PT Start Time 6712    PT Stop Time 1345    PT Time Calculation (min) 46 min    Activity Tolerance Patient tolerated treatment well    Behavior During Therapy [facility] for tasks assessed/performed           Past Medical History:  Diagnosis Date  . Anxiety   . Dizziness   . Headache   . History of COVID-19 05/09/2020  . Hydrocephalus ([place])   . Pituitary tumor   . Pseudotumor cerebri     Past Surgical History:  Procedure Laterality Date  . ANTERIOR CRUCIATE LIGAMENT REPAIR Left 07/18/2020   Procedure: left knee ACL reconstruction,medial  meniscal repair, debridement, QUADRICEP AUTOGRAFT;  Surgeon: Meredith Pel, MD;  Location: [place];  Service: Orthopedics;  Laterality: Left;  . SHOULDER CLOSED REDUCTION Left 09/09/2020   Procedure: LEFT KNEE MANIPULATION UNDER ANESTHESIA;  Surgeon: Meredith Pel, MD;  Location: [place];  Service: Orthopedics;  Laterality: Left;  Marland Kitchen VENTRICULOPERITONEAL SHUNT  2020    There were no vitals filed for this visit.   Subjective Assessment - 09/12/20 1259    Subjective had a manipulation on Friday, using CPM over the weekend.    Patient Stated Goals have surgery, be able to run/jump again    Currently in Pain? Yes    Pain Score 6     Pain Location Knee    Pain Orientation Left    Pain Descriptors / Indicators Aching;Tightness     Pain Type Surgical pain              [facility] PT Assessment - 09/12/20 0001      Assessment   Medical Diagnosis Lt knee ACL repair with medial meniscus repair 3/14   with manipulation 09/08/20   Referring Provider (PT) Dr. Marlou Sa    Next MD Visit 09/17/2020      AROM   Left Knee Flexion 88      PROM   Left Knee Flexion 95                         [facility] Adult PT Treatment/Exercise - 09/12/20 0001      Ambulation/Gait   Gait Comments pre- gait working on stance phase Lt LE , then progressing to ffd ambulation cues for Lt knee flexion at toe off.      Knee/Hip Exercises: Stretches   Sports administrator Left;5 reps;30 seconds   prone with strap - bent to 90 degrees     Knee/Hip Exercises: Aerobic   Recumbent Bike for ROM    Nustep U/LE to work on [employer]      Knee/Hip Exercises: Machines for Strengthening   Cybex Knee Extension to work on knee flexion, VC to keep hip down. Low load long duration holds (  60 sec)      Knee/Hip Exercises: Standing   Forward Step Up 2 sets;10 reps;Step Height: 4"    Forward Step Up Limitations VC to avoid circumduction and to lower with Lt knee flexion not dropping back.      Knee/Hip Exercises: Seated   Other Seated Knee/Hip Exercises 15 reps long sit SLR      Knee/Hip Exercises: Supine   Heel Slides AROM;10 reps;Left    Straight Leg Raises Strengthening;Left;15 reps    Straight Leg Raises Limitations 2 step SLR      Knee/Hip Exercises: Sidelying   Hip ABduction Strengthening;Left;2 sets;10 reps    Hip ABduction Limitations with Rt foot up on chair      Knee/Hip Exercises: Prone   Hamstring Curl 10 reps    Hamstring Curl Limitations therapist over pressure to bend and slow eccentric lowering.    Hip Extension Left;15 reps    Hip Extension Limitations bent knee hi pextension    Contract/Relax to Increase Flexion x5    Other Prone Exercises prone quad set 5 sec hold x 10                    PT Short Term Goals - 09/05/20  1129      PT SHORT TERM GOAL #1   Title independent with initial HEP    Baseline was not compiant, and revised today    Status Not Met    Target Date 08/01/20             PT Long Term Goals - 09/05/20 1129      PT LONG TERM GOAL #1   Title She will be I and compliant with final HEP.    Time 12    Period Weeks    Status On-going      PT LONG TERM GOAL #2   Title Lt knee AROM 0-130 for WNL knee ROM to improve function    Time 12    Period Weeks    Status On-going      PT LONG TERM GOAL #3   Title report pain < 3/10 with amb for improved mobility    Time 12    Period Weeks    Status On-going      PT LONG TERM GOAL #4   Title She will improve Lt hip/knee strength to 5/5 MMT to improve function    Time 12    Period Weeks    Status On-going      PT LONG TERM GOAL #5   Title She will able to ambulate community distances 1,000 feet WNL gait pattern independently and on even/uneven surfaces and stairs.    Time 12    Status On-going                 Plan - 09/12/20 1342    Clinical Impression [place] had a knee manipulation last Friday, she presents today with improved knee flexion, open end feel with PROM by therapis.  She does still have a lot of pain with endrange flexion.  She has developed poor gait mechanics and will need to work to stop these.  Very poor quad stability Lt.  She will be back tomorrow for continued PT    Rehab Potential Good    PT Frequency 2x / week    PT Duration 6 weeks    PT Treatment/Interventions ADLs/Self Care Home Management;Cryotherapy;Electrical Stimulation;Moist Heat;Balance training;Therapeutic exercise;Therapeutic activities;Functional mobility training;Stair training;Gait training;Patient/family education;DME Instruction;Neuromuscular re-education;Manual techniques;Vasopneumatic Device;Taping;Dry  needling;Passive range of motion    PT Next Visit Plan introduce BFR in NWB (held today due to HEP review time), manual for  flexion based gains, NMES may be appropriate.    PT Home Exercise Plan Access Code: [email or web address]    Consulted and Agree with Plan of Care Patient           Patient will benefit from skilled therapeutic intervention in order to improve the following deficits and impairments:  Abnormal gait,Pain,Decreased strength,Decreased mobility,Difficulty walking,Decreased balance,Decreased range of motion  Visit Diagnosis: Acute pain of left knee  Stiffness of left knee, not elsewhere classified  Other abnormalities of gait and mobility  Unsteadiness on feet     Problem List Patient Active Problem List   Diagnosis Date Noted  . Left anterior cruciate ligament tear   . Acute medial meniscal tear, left, sequela   . SIRS (systemic inflammatory response syndrome) ([place]) 02/01/2019  . Allergic reaction 02/01/2019  . 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

## 2020-09-13 ENCOUNTER — Encounter: Payer: No Typology Code available for payment source | Admitting: Physical Therapy

## 2020-09-14 ENCOUNTER — Ambulatory Visit (INDEPENDENT_AMBULATORY_CARE_PROVIDER_SITE_OTHER): Payer: No Typology Code available for payment source | Admitting: Physical Therapy

## 2020-09-14 ENCOUNTER — Other Ambulatory Visit: Payer: Self-pay

## 2020-09-14 ENCOUNTER — Encounter: Payer: Self-pay | Admitting: Physical Therapy

## 2020-09-14 DIAGNOSIS — R2681 Unsteadiness on feet: Secondary | ICD-10-CM | POA: Diagnosis not present

## 2020-09-14 DIAGNOSIS — M25562 Pain in left knee: Secondary | ICD-10-CM | POA: Diagnosis not present

## 2020-09-14 DIAGNOSIS — M25662 Stiffness of left knee, not elsewhere classified: Secondary | ICD-10-CM | POA: Diagnosis not present

## 2020-09-14 DIAGNOSIS — R2689 Other abnormalities of gait and mobility: Secondary | ICD-10-CM

## 2020-09-14 NOTE — Therapy (Signed)
[place], [place], [address] Phone: [phone]   Fax:  [phone]  Physical Therapy Treatment  Patient Details  Name: Paula Haley MRN: [id number] Date of Birth: 07-12-2000 Referring Provider (PT): Dr. Marlou Sa   Encounter Date: 09/14/2020   PT End of Session - 09/14/20 1314    Visit Number 6    Number of Visits 15    Date for PT Re-Evaluation 11/22/20    Authorization Type VA -    Authorization Time Period 05/25/20-11/22/20    Authorization - Visit Number 6    Authorization - Number of Visits 15    Progress Note Due on Visit 10    PT Start Time 1300    PT Stop Time 1340    PT Time Calculation (min) 40 min    Activity Tolerance Patient tolerated treatment well    Behavior During Therapy [facility] for tasks assessed/performed           Past Medical History:  Diagnosis Date  . Anxiety   . Dizziness   . Headache   . History of COVID-19 05/09/2020  . Hydrocephalus ([place])   . Pituitary tumor   . Pseudotumor cerebri     Past Surgical History:  Procedure Laterality Date  . ANTERIOR CRUCIATE LIGAMENT REPAIR Left 07/18/2020   Procedure: left knee ACL reconstruction,medial  meniscal repair, debridement, QUADRICEP AUTOGRAFT;  Surgeon: Meredith Pel, MD;  Location: [place];  Service: Orthopedics;  Laterality: Left;  . SHOULDER CLOSED REDUCTION Left 09/09/2020   Procedure: LEFT KNEE MANIPULATION UNDER ANESTHESIA;  Surgeon: Meredith Pel, MD;  Location: [place];  Service: Orthopedics;  Laterality: Left;  Marland Kitchen VENTRICULOPERITONEAL SHUNT  2020    There were no vitals filed for this visit.   Subjective Assessment - 09/14/20 1310    Subjective Pt reporting using her CPM 2 hours on average since her surgery. Pt reporting 8-9/10 pain with bending and no pain at rest.    Limitations Standing;Walking    Patient Stated Goals have surgery, be able to run/jump again    Currently in Pain? Yes    Pain Score 8      Pain Location Knee    Pain Orientation Left    Pain Descriptors / Indicators Aching;Tightness;Sore    Pain Type Surgical pain    Pain Onset More than a month ago    Pain Frequency Intermittent              [facility] PT Assessment - 09/14/20 0001      Assessment   Medical Diagnosis Lt knee ACL repair with medial meniscus repair 3/14   with manipulation 09/08/20   Referring Provider (PT) Dr. Marlou Sa    Next MD Visit 09/17/2020      AROM   Left Knee Flexion 90   prone     PROM   Left Knee Flexion 98   prone                        [facility] Adult PT Treatment/Exercise - 09/14/20 0001      Ambulation/Gait   Gait Comments working on toe off and increased left knee flexion and heel strike      Knee/Hip Exercises: Stretches   Sports administrator Left;5 reps;30 seconds   prone with strap - bent to 90 degrees     Knee/Hip Exercises: Aerobic   Recumbent Bike full revolution today after a couple of minutes of rocking back and forth  for ROM,      Knee/Hip Exercises: Machines for Strengthening   Cybex Knee Extension to work on knee flexion, VC to keep hip down. Low load long duration holds ( 60 sec)    Total Gym Leg Press R LE only: 37# 3x10      Knee/Hip Exercises: Standing   Forward Step Up 2 sets;10 reps;Step Height: 4"    Forward Step Up Limitations verbal cues to prevent hip hiking and circumduction      Knee/Hip Exercises: Seated   Other Seated Knee/Hip Exercises SLR: in chiar 2x10      Knee/Hip Exercises: Supine   Straight Leg Raises Strengthening;Left;15 reps    Straight Leg Raises Limitations with quad set prior to lifting      Knee/Hip Exercises: Prone   Hamstring Curl 10 reps    Hamstring Curl Limitations therapist over pressure to bend and slow eccentric lowering.                  PT Education - 09/14/20 1312    Education Details importance of corrective gait to limit compensation    Person(s) Educated Patient    Methods Explanation;Demonstration;Verbal  cues    Comprehension Verbalized understanding;Returned demonstration            PT Short Term Goals - 09/14/20 1324      PT SHORT TERM GOAL #1   Title independent with initial HEP    Status On-going             PT Long Term Goals - 09/14/20 1325      PT LONG TERM GOAL #1   Title She will be I and compliant with final HEP.      PT LONG TERM GOAL #2   Title Lt knee AROM 0-130 for WNL knee ROM to improve function    Status On-going      PT LONG TERM GOAL #3   Title report pain < 3/10 with amb for improved mobility    Status On-going      PT LONG TERM GOAL #4   Title She will improve Lt hip/knee strength to 5/5 MMT to improve function    Status On-going      PT LONG TERM GOAL #5   Title She will able to ambulate community distances 1,000 feet WNL gait pattern independently and on even/uneven surfaces and stairs.    Status On-going                 Plan - 09/14/20 1315    Clinical Impression Statement Pt s/p manipulation last Friday. Pt was finally able to make full revloution on the bike today after warming up. Pt with contant instructions to prevent hip hiking and circumduction during amb and with standing exercises. Concentration on left knee flexion/extension and quad strengtheing. Consider BFR at next visit. Continue skilled PT to mazimize function for pt to return to sports recreation.    Personal Factors and Comorbidities Comorbidity 1    Comorbidities pituitary tumor s/p VP shunt    Examination-Activity Limitations Bed Mobility;Bend;Squat;Stairs;Stand;Locomotion Level;Transfers    Examination-Participation Restrictions Community Activity;Occupation;Driving    Stability/Clinical Decision Making Stable/Uncomplicated    Rehab Potential Good    PT Frequency 2x / week    PT Duration 6 weeks    PT Treatment/Interventions ADLs/Self Care Home Management;Cryotherapy;Electrical Stimulation;Moist Heat;Balance training;Therapeutic exercise;Therapeutic  activities;Functional mobility training;Stair training;Gait training;Patient/family education;DME Instruction;Neuromuscular re-education;Manual techniques;Vasopneumatic Device;Taping;Dry needling;Passive range of motion    PT Next Visit Plan introduce BFR in  NWB (held today due another pt using the device), manual for flexion based gains, quad strengthening    PT Home Exercise Plan Access Code: [email or web address]    Consulted and Agree with Plan of Care Patient           Patient will benefit from skilled therapeutic intervention in order to improve the following deficits and impairments:  Abnormal gait,Pain,Decreased strength,Decreased mobility,Difficulty walking,Decreased balance,Decreased range of motion  Visit Diagnosis: Acute pain of left knee  Stiffness of left knee, not elsewhere classified  Other abnormalities of gait and mobility  Unsteadiness on feet     Problem List Patient Active Problem List   Diagnosis Date Noted  . Left anterior cruciate ligament tear   . Acute medial meniscal tear, left, sequela   . SIRS (systemic inflammatory response syndrome) ([place]) 02/01/2019  . Allergic reaction 02/01/2019  . 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

## 2020-09-16 ENCOUNTER — Inpatient Hospital Stay: Payer: No Typology Code available for payment source | Admitting: Surgical

## 2020-09-16 ENCOUNTER — Other Ambulatory Visit (INDEPENDENT_AMBULATORY_CARE_PROVIDER_SITE_OTHER): Payer: Self-pay

## 2020-09-16 ENCOUNTER — Other Ambulatory Visit: Payer: Self-pay

## 2020-09-16 ENCOUNTER — Ambulatory Visit (INDEPENDENT_AMBULATORY_CARE_PROVIDER_SITE_OTHER): Payer: No Typology Code available for payment source | Admitting: Rehabilitative and Restorative Service Providers"

## 2020-09-16 ENCOUNTER — Ambulatory Visit (INDEPENDENT_AMBULATORY_CARE_PROVIDER_SITE_OTHER): Payer: No Typology Code available for payment source | Admitting: Surgical

## 2020-09-16 ENCOUNTER — Encounter: Payer: Self-pay | Admitting: Rehabilitative and Restorative Service Providers"

## 2020-09-16 DIAGNOSIS — M25662 Stiffness of left knee, not elsewhere classified: Secondary | ICD-10-CM

## 2020-09-16 DIAGNOSIS — R2689 Other abnormalities of gait and mobility: Secondary | ICD-10-CM

## 2020-09-16 DIAGNOSIS — M25562 Pain in left knee: Secondary | ICD-10-CM

## 2020-09-16 DIAGNOSIS — R2681 Unsteadiness on feet: Secondary | ICD-10-CM

## 2020-09-16 DIAGNOSIS — S83512A Sprain of anterior cruciate ligament of left knee, initial encounter: Secondary | ICD-10-CM

## 2020-09-16 NOTE — Progress Notes (Signed)
Post-Op Visit Note   Patient: Paula Haley           Date of Birth: 2001-03-10           MRN: [id number] Visit Date: 09/16/2020 PCP: Clinic, [place]:  Chief Complaint:  Chief Complaint  Patient presents with  . Left Knee - Follow-up   Visit Diagnoses:  1. Complete tear of anterior cruciate ligament of left knee, initial encounter     Plan: Patient is a 20 year old female who presents s/p left knee anterior cruciate ligament reconstruction with quadricep autograft on 07/19/2020 and following left knee manipulation under anesthesia on 09/09/2020.  She is doing well and pain is controlled with occasional hydrocodone which she takes about 1-2 times per day.  She states she does not need a refill for this.  She has been using the CPM machine and doing therapy exercises to focus on keeping her knee bending.  On exam today she has well-healed incisions with small effusion.  She extends to 5 degrees and flexes to 90 degrees fairly easily.  This is definitely an improvement compared with prior to surgery where she is gone to about 70 degrees.  ACL graft is intact by anterior drawer and Lachman exam.  No calf tenderness.  Negative Homans' sign.  She is ambulating with a slight limp but strongly encouraged her to continue working on getting her leg fully straight as she has done in the past and to continue working on her knee flexion.  She understands and will pursue full knee extension by applying gentle steady pressure to her knee while it is propped up.  She will continue working on knee flexion with stationary bike, knee range of motion exercises that she learned in PT.  Follow-up in 3 weeks for clinical recheck with Dr. Marlou Sa.  Follow-Up Instructions: No follow-ups on file.   Orders:  No orders of the defined types were placed in this encounter.  No orders of the defined types were placed in this encounter.   Imaging: No results found.  PMFS History: Patient  Active Problem List   Diagnosis Date Noted  . Left anterior cruciate ligament tear   . Acute medial meniscal tear, left, sequela   . SIRS (systemic inflammatory response syndrome) ([place]) 02/01/2019  . Allergic reaction 02/01/2019  . 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 Anxiety   . Dizziness   . Headache   . History of COVID-19 05/09/2020  . Hydrocephalus ([place])   . Pituitary tumor   . Pseudotumor cerebri     Family History  Problem Relation Age of Onset  . Healthy Mother   . Healthy Father     Past Surgical History:  Procedure Laterality Date  . ANTERIOR CRUCIATE LIGAMENT REPAIR Left 07/18/2020   Procedure: left knee ACL reconstruction,medial  meniscal repair, debridement, QUADRICEP AUTOGRAFT;  Surgeon: Meredith Pel, MD;  Location: [place];  Service: Orthopedics;  Laterality: Left;  . SHOULDER CLOSED REDUCTION Left 09/09/2020   Procedure: LEFT KNEE MANIPULATION UNDER ANESTHESIA;  Surgeon: Meredith Pel, MD;  Location: [place];  Service: Orthopedics;  Laterality: Left;  Marland Kitchen VENTRICULOPERITONEAL SHUNT  2020   Social History   Occupational History  . Not on file  Tobacco Use  . Smoking status: Former Smoker    Types: Cigarettes  . Smokeless tobacco: Never Used  . Tobacco comment: Occasional cigarette smoker  Vaping Use  . Vaping Use:  Never used  Substance and Sexual Activity  . Alcohol use: Never  . Drug use: Yes    Types: Marijuana    Comment: former user  . Sexual activity: Yes    Birth control/protection: None    Comment: sexual intercourse with women only

## 2020-09-16 NOTE — Therapy (Signed)
[place], [place], [address] Phone: [phone]   Fax:  [phone]  Physical Therapy Treatment  Patient Details  Name: Paula Haley MRN: [id number] Date of Birth: 08/14/2000 Referring Provider (PT): Dr. Marlou Sa   Encounter Date: 09/16/2020   PT End of Session - 09/16/20 1143    Visit Number 7    Number of Visits 15    Date for PT Re-Evaluation 11/22/20    Authorization Type VA -    Authorization Time Period 05/25/20-11/22/20    Authorization - Visit Number 7    Authorization - Number of Visits 15    Progress Note Due on Visit 10    PT Start Time 1140    PT Stop Time 1220    PT Time Calculation (min) 40 min    Activity Tolerance Patient tolerated treatment well    Behavior During Therapy [facility] for tasks assessed/performed           Past Medical History:  Diagnosis Date  . Anxiety   . Dizziness   . Headache   . History of COVID-19 05/09/2020  . Hydrocephalus ([place])   . Pituitary tumor   . Pseudotumor cerebri     Past Surgical History:  Procedure Laterality Date  . ANTERIOR CRUCIATE LIGAMENT REPAIR Left 07/18/2020   Procedure: left knee ACL reconstruction,medial  meniscal repair, debridement, QUADRICEP AUTOGRAFT;  Surgeon: Meredith Pel, MD;  Location: [place];  Service: Orthopedics;  Laterality: Left;  . SHOULDER CLOSED REDUCTION Left 09/09/2020   Procedure: LEFT KNEE MANIPULATION UNDER ANESTHESIA;  Surgeon: Meredith Pel, MD;  Location: [place];  Service: Orthopedics;  Laterality: Left;  Marland Kitchen VENTRICULOPERITONEAL SHUNT  2020    There were no vitals filed for this visit.   Subjective Assessment - 09/16/20 1143    Subjective Pt. indicated no real pain upon arrival today, still feeling the bending troubles.    Limitations Standing;Walking    Patient Stated Goals have surgery, be able to run/jump again    Currently in Pain? No/denies    Pain Score 0-No pain    Pain Onset More than a  month ago                             [facility] Adult PT Treatment/Exercise - 09/16/20 0001      Blood Flow Restriction   Blood Flow Restriction Yes      Blood Flow Restriction-Positions    Blood Flow Restriction Position Sitting      BFR Sitting   Sitting Limb Occulsion Pressure (mmHg) 184    Sitting Exercise Pressure (mmHg) 136    Sitting Exercise Prescription comment   listed in exercise routine below   Sitting Exercise Prescription Comment cuff size 3      Knee/Hip Exercises: Aerobic   Recumbent Bike Lvl 2 seat 5 10 mins      Knee/Hip Exercises: Machines for Strengthening   Cybex Knee Flexion Lt 3 x 10 10 lbs    Total Gym Leg Press Rt LE only: 37# 3x10 (flexion to tolerance)    Other Machine static low load long duration Lt knee flexion stretch on extension machine 2 mins (pin hole 3rd from back      Knee/Hip Exercises: Seated   Other Seated Knee/Hip Exercises quad set foot press into exercise ball at wall 5 sec on/off 5 mins c BFR 136 mmHg    Other Seated Knee/Hip Exercises SLR in  chair c BFR 136 mmHg 3 x 15                    PT Short Term Goals - 09/14/20 1324      PT SHORT TERM GOAL #1   Title independent with initial HEP    Status On-going             PT Long Term Goals - 09/14/20 1325      PT LONG TERM GOAL #1   Title She will be I and compliant with final HEP.      PT LONG TERM GOAL #2   Title Lt knee AROM 0-130 for WNL knee ROM to improve function    Status On-going      PT LONG TERM GOAL #3   Title report pain < 3/10 with amb for improved mobility    Status On-going      PT LONG TERM GOAL #4   Title She will improve Lt hip/knee strength to 5/5 MMT to improve function    Status On-going      PT LONG TERM GOAL #5   Title She will able to ambulate community distances 1,000 feet WNL gait pattern independently and on even/uneven surfaces and stairs.    Status On-going                 Plan - 09/16/20 1159     Clinical Impression Statement Good overall tolerance to BFR use today, normal fatigue complaints noted in activity.  Continued deficits in flexion and full extension active mobility.  Pt. status is being usual post surgical progression in part to mobility deficits and will benefit from progression into further strengthening and balance coordination while continued work on mobility.    Personal Factors and Comorbidities Comorbidity 1    Comorbidities pituitary tumor s/p VP shunt    Examination-Activity Limitations Bed Mobility;Bend;Squat;Stairs;Stand;Locomotion Level;Transfers    Examination-Participation Restrictions Community Activity;Occupation;Driving    Stability/Clinical Decision Making Stable/Uncomplicated    Rehab Potential Good    PT Frequency 2x / week    PT Duration 6 weeks    PT Treatment/Interventions ADLs/Self Care Home Management;Cryotherapy;Electrical Stimulation;Moist Heat;Balance training;Therapeutic exercise;Therapeutic activities;Functional mobility training;Stair training;Gait training;Patient/family education;DME Instruction;Neuromuscular re-education;Manual techniques;Vasopneumatic Device;Taping;Dry needling;Passive range of motion    PT Next Visit Plan manual for flexion based gains, quad strengthening c BFR in WB, NWB    PT Home Exercise Plan Access Code: [email or web address]    Consulted and Agree with Plan of Care Patient           Patient will benefit from skilled therapeutic intervention in order to improve the following deficits and impairments:  Abnormal gait,Pain,Decreased strength,Decreased mobility,Difficulty walking,Decreased balance,Decreased range of motion  Visit Diagnosis: Acute pain of left knee  Stiffness of left knee, not elsewhere classified  Other abnormalities of gait and mobility  Unsteadiness on feet     Problem List Patient Active Problem List   Diagnosis Date Noted  . Left anterior cruciate ligament tear   . Acute medial meniscal tear, left,  sequela   . SIRS (systemic inflammatory response syndrome) ([place]) 02/01/2019  . Allergic reaction 02/01/2019  . 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 [place], [place], [address] Phone: [phone]   Fax:  [phone]  Name: Leonora Gores MRN: [id number] Date of Birth: 04/14/2001

## 2020-09-17 ENCOUNTER — Encounter: Payer: Self-pay | Admitting: Surgical

## 2020-09-19 MED ORDER — HYDROCODONE-ACETAMINOPHEN 5-325 MG PO TABS: 1.0000 | ORAL_TABLET | Freq: Every day | ORAL | 0 refills | Status: DC | PRN

## 2020-09-20 ENCOUNTER — Encounter: Payer: No Typology Code available for payment source | Admitting: Physical Therapy

## 2020-09-22 ENCOUNTER — Encounter: Payer: Self-pay | Admitting: Physical Therapy

## 2020-09-22 ENCOUNTER — Ambulatory Visit (INDEPENDENT_AMBULATORY_CARE_PROVIDER_SITE_OTHER): Payer: No Typology Code available for payment source | Admitting: Physical Therapy

## 2020-09-22 ENCOUNTER — Other Ambulatory Visit: Payer: Self-pay

## 2020-09-22 DIAGNOSIS — R2689 Other abnormalities of gait and mobility: Secondary | ICD-10-CM

## 2020-09-22 DIAGNOSIS — M25662 Stiffness of left knee, not elsewhere classified: Secondary | ICD-10-CM | POA: Diagnosis not present

## 2020-09-22 DIAGNOSIS — R2681 Unsteadiness on feet: Secondary | ICD-10-CM

## 2020-09-22 DIAGNOSIS — M25562 Pain in left knee: Secondary | ICD-10-CM

## 2020-09-22 NOTE — Therapy (Signed)
[place], [place], [address] Phone: [phone]   Fax:  [phone]  Physical Therapy Treatment  Patient Details  Name: Paula Haley MRN: [id number] Date of Birth: [birth year] Referring Provider (PT): Dr. Marlou Sa   Encounter Date: 09/22/2020   PT End of Session - 09/22/20 1512    Visit Number 8    Number of Visits 15    Date for PT Re-Evaluation 11/22/20    Authorization Type VA -    Authorization Time Period 05/25/20-11/22/20    Authorization - Visit Number 8    Authorization - Number of Visits 15    Progress Note Due on Visit 10    PT Start Time 1426    PT Stop Time 1515    PT Time Calculation (min) 49 min    Activity Tolerance Patient tolerated treatment well    Behavior During Therapy [facility] for tasks assessed/performed           Past Medical History:  Diagnosis Date  . Anxiety   . Dizziness   . Headache   . History of COVID-19 05/09/2020  . Hydrocephalus ([place])   . Pituitary tumor   . Pseudotumor cerebri     Past Surgical History:  Procedure Laterality Date  . ANTERIOR CRUCIATE LIGAMENT REPAIR Left 07/18/2020   Procedure: left knee ACL reconstruction,medial  meniscal repair, debridement, QUADRICEP AUTOGRAFT;  Surgeon: Meredith Pel, MD;  Location: [place];  Service: Orthopedics;  Laterality: Left;  . SHOULDER CLOSED REDUCTION Left 09/09/2020   Procedure: LEFT KNEE MANIPULATION UNDER ANESTHESIA;  Surgeon: Meredith Pel, MD;  Location: [place];  Service: Orthopedics;  Laterality: Left;  Marland Kitchen VENTRICULOPERITONEAL SHUNT  2020    There were no vitals filed for this visit.   Subjective Assessment - 09/22/20 1426    Subjective pain only at end range    Limitations Standing;Walking    Patient Stated Goals have surgery, be able to run/jump again    Currently in Pain? No/denies              [facility] PT Assessment - 09/22/20 1450      Assessment   Medical Diagnosis Lt knee ACL repair  with medial meniscus repair 3/14    Referring Provider (PT) Dr. Marlou Sa      PROM   Left Knee Flexion 104   after heelslides                        [facility] Adult PT Treatment/Exercise - 09/22/20 1429      Blood Flow Restriction-Positions    Blood Flow Restriction Position Supine      BFR-Supine   Supine Limb Occulsion Pressure (mmHg) 184    Supine Exercise Pressure (mmHg) 136    Supine Exercise Prescription 30,15,15,15, reps w/ 30-60 sec rest    Supine Exercise Prescription Comment cuff size 3      Knee/Hip Exercises: Stretches   Knee: Self-Stretch Limitations seated overpressure LLLD stretch      Knee/Hip Exercises: Aerobic   Recumbent Bike seat 4; partial to full revolutions working on ROM      Knee/Hip Exercises: Seated   Long Arc Quad Left   BFR     Knee/Hip Exercises: Supine   Quad Sets Left   BFR   Heel Slides AAROM;Left;10 reps    Heel Slides Limitations self overpressure in long sitting  PT Short Term Goals - 09/14/20 1324      PT SHORT TERM GOAL #1   Title independent with initial HEP    Status On-going             PT Long Term Goals - 09/14/20 1325      PT LONG TERM GOAL #1   Title She will be I and compliant with final HEP.      PT LONG TERM GOAL #2   Title Lt knee AROM 0-130 for WNL knee ROM to improve function    Status On-going      PT LONG TERM GOAL #3   Title report pain < 3/10 with amb for improved mobility    Status On-going      PT LONG TERM GOAL #4   Title She will improve Lt hip/knee strength to 5/5 MMT to improve function    Status On-going      PT LONG TERM GOAL #5   Title She will able to ambulate community distances 1,000 feet WNL gait pattern independently and on even/uneven surfaces and stairs.    Status On-going                 Plan - 09/22/20 1513    Clinical Impression Statement Pt tolerated session well today getting to 104 deg PROM, and stressed importance of continued ROM  at home.  Will continue to benefit from PT to maximize function.    Personal Factors and Comorbidities Comorbidity 1    Comorbidities pituitary tumor s/p VP shunt    Examination-Activity Limitations Bed Mobility;Bend;Squat;Stairs;Stand;Locomotion Level;Transfers    Examination-Participation Restrictions Community Activity;Occupation;Driving    Stability/Clinical Decision Making Stable/Uncomplicated    Rehab Potential Good    PT Frequency 2x / week    PT Duration 6 weeks    PT Treatment/Interventions ADLs/Self Care Home Management;Cryotherapy;Electrical Stimulation;Moist Heat;Balance training;Therapeutic exercise;Therapeutic activities;Functional mobility training;Stair training;Gait training;Patient/family education;DME Instruction;Neuromuscular re-education;Manual techniques;Vasopneumatic Device;Taping;Dry needling;Passive range of motion    PT Next Visit Plan manual for flexion based gains, quad strengthening c BFR in WB, NWB    PT Home Exercise Plan Access Code: [email or web address]    Consulted and Agree with Plan of Care Patient           Patient will benefit from skilled therapeutic intervention in order to improve the following deficits and impairments:  Abnormal gait,Pain,Decreased strength,Decreased mobility,Difficulty walking,Decreased balance,Decreased range of motion  Visit Diagnosis: Acute pain of left knee  Stiffness of left knee, not elsewhere classified  Other abnormalities of gait and mobility  Unsteadiness on feet     Problem List Patient Active Problem List   Diagnosis Date Noted  . Left anterior cruciate ligament tear   . Acute medial meniscal tear, left, sequela   . SIRS (systemic inflammatory response syndrome) ([place]) 02/01/2019  . Allergic reaction 02/01/2019  . 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 [place], [place], [address] Phone: [phone]   Fax:   [phone]  Name: Paula Haley MRN: [id number] Date of Birth: August 28, 2000

## 2020-09-23 ENCOUNTER — Encounter: Payer: No Typology Code available for payment source | Admitting: Rehabilitative and Restorative Service Providers"

## 2020-09-23 NOTE — Telephone Encounter (Signed)
Sent in RX couple days ago

## 2020-09-26 ENCOUNTER — Encounter: Payer: No Typology Code available for payment source | Admitting: Physical Therapy

## 2020-09-28 ENCOUNTER — Other Ambulatory Visit: Payer: Self-pay

## 2020-09-28 ENCOUNTER — Encounter: Payer: Self-pay | Admitting: Rehabilitative and Restorative Service Providers"

## 2020-09-28 ENCOUNTER — Ambulatory Visit (INDEPENDENT_AMBULATORY_CARE_PROVIDER_SITE_OTHER): Payer: No Typology Code available for payment source | Admitting: Rehabilitative and Restorative Service Providers"

## 2020-09-28 DIAGNOSIS — M25562 Pain in left knee: Secondary | ICD-10-CM

## 2020-09-28 DIAGNOSIS — R2689 Other abnormalities of gait and mobility: Secondary | ICD-10-CM | POA: Diagnosis not present

## 2020-09-28 DIAGNOSIS — R2681 Unsteadiness on feet: Secondary | ICD-10-CM

## 2020-09-28 DIAGNOSIS — M25662 Stiffness of left knee, not elsewhere classified: Secondary | ICD-10-CM | POA: Diagnosis not present

## 2020-09-28 NOTE — Therapy (Addendum)
[facility] [address] [place], [place], [address] Phone: [phone]   Fax:  [phone]  Physical Therapy Treatment/Discharge Summary  Patient Details  Name: Paula Haley MRN: [id number] Date of Birth: 2000/07/20 Referring Provider (PT): Dr. Marlou Sa   Encounter Date: 09/28/2020   PT End of Session - 09/28/20 0830     Visit Number 9    Number of Visits 15    Date for PT Re-Evaluation 11/22/20    Authorization Type VA -    Authorization Time Period 05/25/20-11/22/20    Authorization - Visit Number 9    Authorization - Number of Visits 15    Progress Note Due on Visit 10    PT Start Time 0835    PT Stop Time 0915    PT Time Calculation (min) 40 min    Activity Tolerance Patient tolerated treatment well    Behavior During Therapy [facility] for tasks assessed/performed             Past Medical History:  Diagnosis Date   Anxiety    Dizziness    Headache    History of COVID-19 05/09/2020   Hydrocephalus [facility])    Pituitary tumor    Pseudotumor cerebri     Past Surgical History:  Procedure Laterality Date   ANTERIOR CRUCIATE LIGAMENT REPAIR Left 07/18/2020   Procedure: left knee ACL reconstruction,medial  meniscal repair, debridement, QUADRICEP AUTOGRAFT;  Surgeon: Meredith Pel, MD;  Location: [place];  Service: Orthopedics;  Laterality: Left;   SHOULDER CLOSED REDUCTION Left 09/09/2020   Procedure: LEFT KNEE MANIPULATION UNDER ANESTHESIA;  Surgeon: Meredith Pel, MD;  Location: [place];  Service: Orthopedics;  Laterality: Left;   VENTRICULOPERITONEAL SHUNT  2020    There were no vitals filed for this visit.   Subjective Assessment - 09/28/20 0838     Subjective Pt. stated no pain upon arrival with walking. Still noting stiffness.    Limitations Standing;Walking    Patient Stated Goals have surgery, be able to run/jump again    Currently in Pain? No/denies                                [facility] Adult PT Treatment/Exercise - 09/28/20 0001       Blood Flow Restriction-Positions    Blood Flow Restriction Position Sitting      BFR Sitting   Sitting Limb Occulsion Pressure (mmHg) 184    Sitting Exercise Pressure (mmHg) 136    Sitting Exercise Prescription comment   see exercise information   Sitting Exercise Prescription Comment cuff size 3      Knee/Hip Exercises: Stretches   Knee: Self-Stretch Limitations seated overpressure LLLD stretch 1 min      Knee/Hip Exercises: Aerobic   Recumbent Bike Full revolutions Lvl 1 6 mins seat 7 (full extension)      Knee/Hip Exercises: Standing   Other Standing Knee Exercises eccentric squat to table focus 2 x 15 (left foot half foot posterior to Rt to use Lt LE more)   BFR 136 mmHg     Knee/Hip Exercises: Seated   Long Arc Quad Left   BFR 30x, 3 x 15 c 30 sec rests   Long Arc Quad Weight 2 lbs.    Other Seated Knee/Hip Exercises quad set foot press into exercise ball at wall 5 sec on/off 5 mins c BFR 136 mmHg    Other Seated Knee/Hip Exercises --  Knee/Hip Exercises: Supine   Quad Sets Limitations quad set performed in sitting today c ball resistance                      PT Short Term Goals - 09/14/20 1324       PT SHORT TERM GOAL #1   Title independent with initial HEP    Status On-going               PT Long Term Goals - 09/14/20 1325       PT LONG TERM GOAL #1   Title She will be I and compliant with final HEP.      PT LONG TERM GOAL #2   Title Lt knee AROM 0-130 for WNL knee ROM to improve function    Status On-going      PT LONG TERM GOAL #3   Title report pain < 3/10 with amb for improved mobility    Status On-going      PT LONG TERM GOAL #4   Title She will improve Lt hip/knee strength to 5/5 MMT to improve function    Status On-going      PT LONG TERM GOAL #5   Title She will able to ambulate community distances 1,000 feet WNL gait pattern  independently and on even/uneven surfaces and stairs.    Status On-going                   Plan - 09/28/20 2633     Clinical Impression Statement Ambulation quality improving overall with full knee extension deficit still noted.  Reduced hip hike and circumduction required in swing.  Active knee extension in LAQ improved to lacking about 5-6 degrees approx. from full extension.  Continued flexion mobility gains to benefit functional mobility.  BFR training seems to be helpful for strengthening at this time.    Personal Factors and Comorbidities Comorbidity 1    Comorbidities pituitary tumor s/p VP shunt    Examination-Activity Limitations Bed Mobility;Bend;Squat;Stairs;Stand;Locomotion Level;Transfers    Examination-Participation Restrictions Community Activity;Occupation;Driving    Stability/Clinical Decision Making Stable/Uncomplicated    Rehab Potential Good    PT Frequency 2x / week    PT Duration 6 weeks    PT Treatment/Interventions ADLs/Self Care Home Management;Cryotherapy;Electrical Stimulation;Moist Heat;Balance training;Therapeutic exercise;Therapeutic activities;Functional mobility training;Stair training;Gait training;Patient/family education;DME Instruction;Neuromuscular re-education;Manual techniques;Vasopneumatic Device;Taping;Dry needling;Passive range of motion    PT Next Visit Plan BFR use in quad activation with transitioning to use in closed chain strengthening for functional movements.  Continue knee flexion gains c ther ex and manual mixed in.    PT Home Exercise Plan Access Code: [email or web address]    Consulted and Agree with Plan of Care Patient             Patient will benefit from skilled therapeutic intervention in order to improve the following deficits and impairments:  Abnormal gait,Pain,Decreased strength,Decreased mobility,Difficulty walking,Decreased balance,Decreased range of motion  Visit Diagnosis: Acute pain of left knee  Stiffness of left knee,  not elsewhere classified  Other abnormalities of gait and mobility  Unsteadiness on feet     Problem List Patient Active Problem List   Diagnosis Date Noted   Left anterior cruciate ligament tear    Acute medial meniscal tear, left, sequela    SIRS (systemic inflammatory response syndrome) ([place]) 02/01/2019   Allergic reaction 02/01/2019   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 [place], [place], [address] Phone: [phone]   Fax:  [phone]  Name: Paula Haley MRN: [id number] Date of Birth: August 14, 2000     PHYSICAL THERAPY DISCHARGE SUMMARY  Visits from Start of Care: 9  Current functional level related to goals / functional outcomes: See above   Remaining deficits: unknown   Education / Equipment: HEP   Patient agrees to discharge. Patient goals were not met. Patient is being discharged due to the patient's request.  Pt called cx remaining appts stating she "no longer needs PT."  PT not in agreement with statement.  Laureen Abrahams, PT, DPT 10/25/20 8:27 AM   [facility] [address] [place], [place], [address] Phone: [phone]   Fax:  [phone]

## 2020-10-04 ENCOUNTER — Encounter: Payer: No Typology Code available for payment source | Admitting: Physical Therapy

## 2020-10-05 DIAGNOSIS — M24662 Ankylosis, left knee: Secondary | ICD-10-CM

## 2020-10-07 ENCOUNTER — Encounter: Payer: No Typology Code available for payment source | Admitting: Orthopedic Surgery

## 2020-10-11 ENCOUNTER — Encounter: Payer: No Typology Code available for payment source | Admitting: Rehabilitative and Restorative Service Providers"

## 2020-10-13 ENCOUNTER — Encounter: Payer: No Typology Code available for payment source | Admitting: Physical Therapy

## 2021-01-30 ENCOUNTER — Emergency Department (HOSPITAL_BASED_OUTPATIENT_CLINIC_OR_DEPARTMENT_OTHER): Payer: No Typology Code available for payment source | Admitting: Radiology

## 2021-01-30 ENCOUNTER — Encounter (HOSPITAL_BASED_OUTPATIENT_CLINIC_OR_DEPARTMENT_OTHER): Payer: Self-pay | Admitting: Emergency Medicine

## 2021-01-30 ENCOUNTER — Emergency Department (HOSPITAL_BASED_OUTPATIENT_CLINIC_OR_DEPARTMENT_OTHER): Payer: No Typology Code available for payment source

## 2021-01-30 ENCOUNTER — Other Ambulatory Visit: Payer: Self-pay

## 2021-01-30 DIAGNOSIS — Z8616 Personal history of COVID-19: Secondary | ICD-10-CM | POA: Insufficient documentation

## 2021-01-30 DIAGNOSIS — W010XXA Fall on same level from slipping, tripping and stumbling without subsequent striking against object, initial encounter: Secondary | ICD-10-CM | POA: Diagnosis not present

## 2021-01-30 DIAGNOSIS — Z87891 Personal history of nicotine dependence: Secondary | ICD-10-CM | POA: Diagnosis not present

## 2021-01-30 DIAGNOSIS — M25562 Pain in left knee: Secondary | ICD-10-CM | POA: Diagnosis not present

## 2021-01-30 DIAGNOSIS — T1490XA Injury, unspecified, initial encounter: Secondary | ICD-10-CM

## 2021-01-30 DIAGNOSIS — M25571 Pain in right ankle and joints of right foot: Secondary | ICD-10-CM | POA: Diagnosis not present

## 2021-01-31 ENCOUNTER — Emergency Department (HOSPITAL_BASED_OUTPATIENT_CLINIC_OR_DEPARTMENT_OTHER)
Admission: EM | Admit: 2021-01-31 | Discharge: 2021-01-31 | Disposition: A | Discharge status: Home / Self Care | Payer: No Typology Code available for payment source | Attending: Emergency Medicine | Admitting: Emergency Medicine

## 2021-01-31 DIAGNOSIS — T1490XA Injury, unspecified, initial encounter: Secondary | ICD-10-CM

## 2021-01-31 DIAGNOSIS — M25571 Pain in right ankle and joints of right foot: Secondary | ICD-10-CM

## 2021-01-31 DIAGNOSIS — M25562 Pain in left knee: Secondary | ICD-10-CM

## 2021-01-31 NOTE — Discharge Instructions (Addendum)
You were evaluated in the Emergency Department and after careful evaluation, we did not find any emergent condition requiring admission or further testing in the hospital.  Your exam/testing today is overall reassuring.  X-rays are normal.  Symptoms likely due to bruising or sprain.  Recommend Tylenol or Motrin for discomfort.  Recommend follow-up with your surgeon if your knee continues to have pain after 2 weeks.  Please return to the Emergency Department if you experience any worsening of your condition.   Thank you for allowing [place] to be a part of your care.

## 2021-01-31 NOTE — ED Triage Notes (Signed)
Pt via pov from home; suffered a fall earlier today. She states that she rolled her right ankle and fell onto her left knee. She is concerned because she had acl repair and was told that any major injury could be a real problem for the knee. Pt alert & oriented, nad noted.

## 2021-01-31 NOTE — ED Provider Notes (Signed)
[facility] Emergency Department Provider Note MRN:  [id number]  Arrival date & time: 01/31/21     Chief Complaint   Leg Pain (Bilateral)   History of Present Illness   Paula Haley is a 20 y.o. year-old female with no pertinent past medical history presenting to the ED with chief complaint of leg pain.  Patient tripped and fell, rolling her right ankle and falling onto her left knee.  She is here for x-rays because she had a recent ACL surgery this past spring of the knee, was told to get it evaluated with any trauma.  She has mild to moderate pain in the left knee and the right ankle.  Trouble bearing weight on the right ankle.  She denies head trauma, no other complaints.  Review of Systems  A problem-focused ROS was performed. Positive for knee pain, ankle pain.  Patient denies head trauma.  Patient's Health History    Past Medical History:  Diagnosis Date   Anxiety    Dizziness    Headache    History of COVID-19 05/09/2020   Hydrocephalus [facility])    Pituitary tumor    Pseudotumor cerebri     Past Surgical History:  Procedure Laterality Date   ANTERIOR CRUCIATE LIGAMENT REPAIR Left 07/18/2020   Procedure: left knee ACL reconstruction,medial  meniscal repair, debridement, QUADRICEP AUTOGRAFT;  Surgeon: Meredith Pel, MD;  Location: [place];  Service: Orthopedics;  Laterality: Left;   SHOULDER CLOSED REDUCTION Left 09/09/2020   Procedure: LEFT KNEE MANIPULATION UNDER ANESTHESIA;  Surgeon: Meredith Pel, MD;  Location: [place];  Service: Orthopedics;  Laterality: Left;   VENTRICULOPERITONEAL SHUNT  2020    Family History  Problem Relation Age of Onset   Healthy Mother    Healthy Father     Social History   Socioeconomic History   Marital status: Single    Spouse name: Not on file   Number of children: Not on file   Years of education: Not on file   Highest education level: Not on file  Occupational History   Not  on file  Tobacco Use   Smoking status: Former    Types: Cigarettes   Smokeless tobacco: Never   Tobacco comments:    Occasional cigarette smoker  Vaping Use   Vaping Use: Never used  Substance and Sexual Activity   Alcohol use: Never   Drug use: Yes    Frequency: 7.0 times per week    Types: Marijuana    Comment: daily   Sexual activity: Yes    Birth control/protection: None    Comment: sexual intercourse with women only  Other Topics Concern   Not on file  Social History Narrative   Not on file   Social Determinants of Health   Financial Resource Strain: Not on file  Food Insecurity: Not on file  Transportation Needs: Not on file  Physical Activity: Not on file  Stress: Not on file  Social Connections: Not on file  Intimate Partner Violence: Not on file     Physical Exam   Vitals:   01/30/21 2233  BP: 104/78  Pulse: 97  Resp: 18  Temp: 98.1 F (36.7 C)  SpO2: 100%    CONSTITUTIONAL: Well-appearing, NAD NEURO:  Alert and oriented x 3, no focal deficits EYES:  eyes equal and reactive ENT/NECK:  no LAD, no JVD CARDIO: Regular rate, well-perfused, normal S1 and S2 PULM:  CTAB no wheezing or rhonchi GI/GU:  normal bowel sounds,  non-distended, non-tender MSK/SPINE:  No gross deformities, no edema, tenderness to palpation to the right ankle and the left knee, knee has no significant laxity, limbs are neurovascularly intact distally SKIN: Abrasion to left knee PSYCH:  Appropriate speech and behavior  *Additional and/or pertinent findings included in MDM below  Diagnostic and Interventional Summary    EKG Interpretation  Date/Time:    Ventricular Rate:    PR Interval:    QRS Duration:   QT Interval:    QTC Calculation:   R Axis:     Text Interpretation:         Labs Reviewed - No data to display  DG Knee Left Port  Final Result    DG Ankle Complete Right  Final Result      Medications - No data to display   Procedures  /  Critical  Care Procedures  ED Course and Medical Decision Making  I have reviewed the triage vital signs, the nursing notes, and pertinent available records from the EMR.  Listed above are laboratory and imaging tests that I personally ordered, reviewed, and interpreted and then considered in my medical decision making (see below for details).  Exam reassuring, x-rays negative, suspect sprain, appropriate for discharge       Barth Kirks. [place] Small, [place] [email or web address]  Final Clinical Impressions(s) / ED Diagnoses     ICD-10-CM   1. Acute right ankle pain  M25.571     2. Injury  T14.90XA DG Knee Left Port    DG Knee Left Port    3. Acute pain of left knee  M25.562       ED Discharge Orders     None        Discharge Instructions Discussed with and Provided to Patient:    Discharge Instructions      You were evaluated in the Emergency Department and after careful evaluation, we did not find any emergent condition requiring admission or further testing in the hospital.  Your exam/testing today is overall reassuring.  X-rays are normal.  Symptoms likely due to bruising or sprain.  Recommend Tylenol or Motrin for discomfort.  Recommend follow-up with your surgeon if your knee continues to have pain after 2 weeks.  Please return to the Emergency Department if you experience any worsening of your condition.   Thank you for allowing [place] to be a part of your care.       Maudie Flakes, MD 01/31/21 [place]

## 2021-06-09 ENCOUNTER — Encounter (HOSPITAL_COMMUNITY): Payer: Self-pay | Admitting: Emergency Medicine

## 2021-06-09 ENCOUNTER — Emergency Department (HOSPITAL_COMMUNITY): Payer: No Typology Code available for payment source

## 2021-06-09 ENCOUNTER — Emergency Department (HOSPITAL_COMMUNITY)
Admission: EM | Admit: 2021-06-09 | Discharge: 2021-06-09 | Disposition: A | Discharge status: Home / Self Care | Payer: No Typology Code available for payment source | Attending: Emergency Medicine | Admitting: Emergency Medicine

## 2021-06-09 DIAGNOSIS — Y991 Military activity: Secondary | ICD-10-CM | POA: Insufficient documentation

## 2021-06-09 DIAGNOSIS — Z79899 Other long term (current) drug therapy: Secondary | ICD-10-CM | POA: Diagnosis not present

## 2021-06-09 DIAGNOSIS — S0011XA Contusion of right eyelid and periocular area, initial encounter: Secondary | ICD-10-CM | POA: Insufficient documentation

## 2021-06-09 DIAGNOSIS — S0012XA Contusion of left eyelid and periocular area, initial encounter: Secondary | ICD-10-CM | POA: Diagnosis not present

## 2021-06-09 DIAGNOSIS — S0990XA Unspecified injury of head, initial encounter: Secondary | ICD-10-CM | POA: Insufficient documentation

## 2021-06-09 DIAGNOSIS — W320XXA Accidental handgun discharge, initial encounter: Secondary | ICD-10-CM | POA: Diagnosis not present

## 2021-06-09 LAB — PREGNANCY, URINE: Preg Test, Ur: NEGATIVE

## 2021-06-09 MED ORDER — LACTATED RINGERS IV BOLUS
1000.0000 mL | Freq: Once | INTRAVENOUS | Status: AC
  Administered 2021-06-09: 1000 mL via INTRAVENOUS

## 2021-06-09 MED ORDER — METOCLOPRAMIDE HCL 5 MG/ML IJ SOLN
10.0000 mg | Freq: Once | INTRAMUSCULAR | Status: AC
  Administered 2021-06-09: 10 mg via INTRAVENOUS
  Filled 2021-06-09: qty 2

## 2021-06-09 MED ORDER — MORPHINE SULFATE (PF) 4 MG/ML IV SOLN
4.0000 mg | Freq: Once | INTRAVENOUS | Status: AC
  Administered 2021-06-09: 4 mg via INTRAVENOUS
  Filled 2021-06-09: qty 1

## 2021-06-09 MED ORDER — IOHEXOL 350 MG/ML SOLN
75.0000 mL | Freq: Once | INTRAVENOUS | Status: AC | PRN
  Administered 2021-06-09: 75 mL via INTRAVENOUS

## 2021-06-09 MED ORDER — DEXAMETHASONE SODIUM PHOSPHATE 10 MG/ML IJ SOLN
10.0000 mg | Freq: Once | INTRAMUSCULAR | Status: AC
  Administered 2021-06-09: 10 mg via INTRAVENOUS
  Filled 2021-06-09: qty 1
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
## 2021-06-09 NOTE — ED Notes (Signed)
Could not complete triage because patient would not stop talking on her phone.
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
## 2021-06-20 ENCOUNTER — Encounter (HOSPITAL_COMMUNITY): Payer: Self-pay | Admitting: Emergency Medicine

## 2021-06-20 ENCOUNTER — Ambulatory Visit (HOSPITAL_COMMUNITY)
Admission: EM | Admit: 2021-06-20 | Discharge: 2021-06-20 | Disposition: A | Discharge status: Home / Self Care | Payer: No Typology Code available for payment source | Attending: Family Medicine | Admitting: Family Medicine

## 2021-06-20 ENCOUNTER — Other Ambulatory Visit: Payer: Self-pay

## 2021-06-20 DIAGNOSIS — N760 Acute vaginitis: Secondary | ICD-10-CM | POA: Diagnosis present

## 2021-06-20 LAB — POCT URINALYSIS DIPSTICK, ED / UC
Glucose, UA: NEGATIVE mg/dL
Hgb urine dipstick: NEGATIVE
Leukocytes,Ua: NEGATIVE
Nitrite: NEGATIVE
Protein, ur: NEGATIVE mg/dL
Specific Gravity, Urine: >=1.03 (ref 1.005–1.030)
Urobilinogen, UA: 0.2 mg/dL (ref 0.0–1.0)
pH: 5.5 (ref 5.0–8.0)

## 2021-06-20 LAB — POC URINE PREG, ED: Preg Test, Ur: NEGATIVE

## 2021-06-20 MED ORDER — FLUCONAZOLE 150 MG PO TABS: 150.0000 mg | ORAL_TABLET | Freq: Once | ORAL | 0 refills | Status: AC

## 2021-06-20 NOTE — ED Triage Notes (Addendum)
Pt reports vaginal discharge x 2 days.  Pt states she has a yeast infection and also requesting STI testing.

## 2021-06-20 NOTE — Discharge Instructions (Addendum)
Take fluconazole 150 mg for 1 dose  Staff will call you if any other tests are positive did not need treatment  Your urinalysis did not show signs of a bladder infection. If he test was negative

## 2021-06-20 NOTE — ED Provider Notes (Signed)
[facility]    CSN: [id number] Arrival date & time: 06/20/21  1558      History   Chief Complaint Chief Complaint  Patient presents with   Vaginal Discharge    HPI Paula Haley is a 21 y.o. female.    Vaginal Discharge For vaginal discharge, but no itching.  She also has some lower abdominal cramps which she attributes to her endometriosis  No fever or chills, and no dysuria.  She has noticed her urine is may be dark.  She just finished her last menstrual period  Past medical history significant for hydrocephalus and she has had a VP shunt put in  Past Medical History:  Diagnosis Date   Anxiety    Dizziness    Headache    History of COVID-19 05/09/2020   Hydrocephalus [facility])    Pituitary tumor    Pseudotumor cerebri     Patient Active Problem List   Diagnosis Date Noted   Fibrosis of left knee joint    Left anterior cruciate ligament tear    Acute medial meniscal tear, left, sequela    SIRS (systemic inflammatory response syndrome) ([place]) 02/01/2019   Allergic reaction 02/01/2019   S/P VP shunt 02/01/2019    Past Surgical History:  Procedure Laterality Date   ANTERIOR CRUCIATE LIGAMENT REPAIR Left 07/18/2020   Procedure: left knee ACL reconstruction,medial  meniscal repair, debridement, QUADRICEP AUTOGRAFT;  Surgeon: Meredith Pel, MD;  Location: [facility];  Service: Orthopedics;  Laterality: Left;   SHOULDER CLOSED REDUCTION Left 09/09/2020   Procedure: LEFT KNEE MANIPULATION UNDER ANESTHESIA;  Surgeon: Meredith Pel, MD;  Location: [facility];  Service: Orthopedics;  Laterality: Left;   VENTRICULOPERITONEAL SHUNT  2020    OB History   No obstetric history on file.      Home Medications    Prior to Admission medications   Medication Sig Start Date End Date Taking? Authorizing Provider  fluconazole (DIFLUCAN) 150 MG tablet Take 1 tablet (150 mg total) by mouth once for 1 dose. 06/20/21 06/20/21 Yes Rylin Saez, Gwenlyn Perking, MD   cholecalciferol (VITAMIN D3) 25 MCG (1000 UNIT) tablet Take 1,000 Units by mouth in the morning.    [provider]    Family History Family History  Problem Relation Age of Onset   Healthy Mother    Healthy Father     Social History Social History   Tobacco Use   Smoking status: Former    Types: Cigarettes   Smokeless tobacco: Never   Tobacco comments:    Occasional cigarette smoker  Vaping Use   Vaping Use: Never used  Substance Use Topics   Alcohol use: Never   Drug use: Yes    Frequency: 7.0 times per week    Types: Marijuana    Comment: daily     Allergies   Naproxen and Penicillins   Review of Systems Review of Systems  Genitourinary:  Positive for vaginal discharge.    Physical Exam Triage Vital Signs ED Triage Vitals  Enc Vitals Group     BP 06/20/21 1613 122/86     Pulse Rate 06/20/21 1613 83     Resp 06/20/21 1613 18     Temp 06/20/21 1613 98.1 F (36.7 C)     Temp Source 06/20/21 1613 Oral     SpO2 06/20/21 1613 99 %     Weight 06/20/21 1615 154 lb 15.7 oz (70.3 kg)     Height 06/20/21 1615 5\' 7"  (1.702 m)  Head Circumference --      Peak Flow --      Pain Score --      Pain Loc --      Pain Edu? --      Excl. in [place]? --    No data found.  Updated Vital Signs BP 122/86 (BP Location: Left Arm)    Pulse 83    Temp 98.1 F (36.7 C) (Oral)    Resp 18    Ht 5\' 7"  (1.702 m)    Wt 70.3 kg    SpO2 99%    BMI 24.27 kg/m   Visual Acuity Right Eye Distance:   Left Eye Distance:   Bilateral Distance:    Right Eye Near:   Left Eye Near:    Bilateral Near:     Physical Exam Vitals reviewed.  Constitutional:      General: She is not in acute distress.    Appearance: She is not toxic-appearing.  Cardiovascular:     Rate and Rhythm: Normal rate and regular rhythm.     Heart sounds: No murmur heard. Pulmonary:     Effort: Pulmonary effort is normal.     Breath sounds: Normal breath sounds.  Abdominal:     Palpations: Abdomen  is soft.     Tenderness: There is no abdominal tenderness.  Skin:    Coloration: Skin is not jaundiced or pale.  Neurological:     Mental Status: She is alert and oriented to person, place, and time.  Psychiatric:        Behavior: Behavior normal.     UC Treatments / Results  Labs (all labs ordered are listed, but only abnormal results are displayed) Labs Reviewed  POCT URINALYSIS DIPSTICK, ED / UC - Abnormal; Notable for the following components:      Result Value   Bilirubin Urine SMALL (*)    Ketones, ur TRACE (*)    All other components within normal limits  POC URINE PREG, ED  CERVICOVAGINAL ANCILLARY ONLY    EKG   Radiology No results found.  Procedures Procedures (including critical care time)  Medications Ordered in UC Medications - No data to display  Initial Impression / Assessment and Plan / UC Course  I have reviewed the triage vital signs and the nursing notes.  Pertinent labs & imaging results that were available during my care of the patient were reviewed by me and considered in my medical decision making (see chart for details).  Clinical Course as of 06/20/21 1720  Tue Jun 20, 2021  1715 POC Urinalysis dipstick(!) [PB]    Clinical Course User Index [PB] Barrett Henle, MD    UA shows ketones, no WBC. Will treat for yeast, and per results for anything else Final Clinical Impressions(s) / UC Diagnoses   Final diagnoses:  Acute vaginitis     Discharge Instructions      Take fluconazole 150 mg for 1 dose  Staff will call you if any other tests are positive did not need treatment  Your urinalysis did not show signs of a bladder infection. If he test was negative     ED Prescriptions     Medication Sig Dispense Auth. Provider   fluconazole (DIFLUCAN) 150 MG tablet Take 1 tablet (150 mg total) by mouth once for 1 dose. 1 tablet Windy Carina Gwenlyn Perking, MD      PDMP not reviewed this encounter.   Barrett Henle, MD 06/20/21  [phone]

## 2021-06-21 ENCOUNTER — Telehealth (HOSPITAL_COMMUNITY): Payer: Self-pay | Admitting: Emergency Medicine

## 2021-06-21 LAB — CERVICOVAGINAL ANCILLARY ONLY
Bacterial Vaginitis (gardnerella): POSITIVE — AB
Candida Glabrata: NEGATIVE
Candida Vaginitis: NEGATIVE
Chlamydia: NEGATIVE
Comment: NEGATIVE
Comment: NEGATIVE
Comment: NEGATIVE
Comment: NEGATIVE
Comment: NEGATIVE
Comment: NORMAL
Neisseria Gonorrhea: NEGATIVE
Trichomonas: NEGATIVE

## 2021-06-21 MED ORDER — METRONIDAZOLE 500 MG PO TABS: 500.0000 mg | ORAL_TABLET | Freq: Two times a day (BID) | ORAL | 0 refills | Status: DC

## 2021-06-21 NOTE — Telephone Encounter (Signed)
Vaginal cytology + BV. Calls to request tx. Sent to pharmacy.  Meds ordered this encounter  Medications   metroNIDAZOLE (FLAGYL) 500 MG tablet    Sig: Take 1 tablet (500 mg total) by mouth 2 (two) times daily.    Dispense:  14 tablet    Refill:  0

## 2021-06-22 ENCOUNTER — Telehealth (HOSPITAL_COMMUNITY): Payer: Self-pay | Admitting: Emergency Medicine

## 2021-07-07 ENCOUNTER — Emergency Department (HOSPITAL_COMMUNITY): Payer: No Typology Code available for payment source

## 2021-07-07 ENCOUNTER — Encounter (HOSPITAL_COMMUNITY): Payer: Self-pay

## 2021-07-07 ENCOUNTER — Emergency Department (HOSPITAL_COMMUNITY)
Admission: EM | Admit: 2021-07-07 | Discharge: 2021-07-07 | Disposition: A | Discharge status: Home / Self Care | Payer: No Typology Code available for payment source | Attending: Emergency Medicine | Admitting: Emergency Medicine

## 2021-07-07 ENCOUNTER — Other Ambulatory Visit: Payer: Self-pay

## 2021-07-07 DIAGNOSIS — S90811A Abrasion, right foot, initial encounter: Secondary | ICD-10-CM | POA: Diagnosis not present

## 2021-07-07 DIAGNOSIS — M79632 Pain in left forearm: Secondary | ICD-10-CM | POA: Insufficient documentation

## 2021-07-07 DIAGNOSIS — Y92410 Unspecified street and highway as the place of occurrence of the external cause: Secondary | ICD-10-CM | POA: Diagnosis not present

## 2021-07-07 DIAGNOSIS — M25512 Pain in left shoulder: Secondary | ICD-10-CM | POA: Insufficient documentation

## 2021-07-07 DIAGNOSIS — S99921A Unspecified injury of right foot, initial encounter: Secondary | ICD-10-CM | POA: Diagnosis present

## 2021-07-07 MED ORDER — METHOCARBAMOL 500 MG PO TABS: 500.0000 mg | ORAL_TABLET | Freq: Two times a day (BID) | ORAL | 0 refills | Status: DC
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
## 2021-07-07 NOTE — Discharge Instructions (Addendum)

## 2021-09-02 ENCOUNTER — Ambulatory Visit (HOSPITAL_COMMUNITY): Payer: Self-pay

## 2021-12-16 ENCOUNTER — Other Ambulatory Visit: Payer: Self-pay

## 2021-12-16 ENCOUNTER — Emergency Department (HOSPITAL_BASED_OUTPATIENT_CLINIC_OR_DEPARTMENT_OTHER)
Admission: EM | Admit: 2021-12-16 | Discharge: 2021-12-17 | Disposition: A | Discharge status: Home / Self Care | Payer: No Typology Code available for payment source | Attending: Emergency Medicine | Admitting: Emergency Medicine

## 2021-12-16 ENCOUNTER — Encounter (HOSPITAL_BASED_OUTPATIENT_CLINIC_OR_DEPARTMENT_OTHER): Payer: Self-pay | Admitting: Emergency Medicine

## 2021-12-16 DIAGNOSIS — Z87891 Personal history of nicotine dependence: Secondary | ICD-10-CM | POA: Diagnosis not present

## 2021-12-16 DIAGNOSIS — B3731 Acute candidiasis of vulva and vagina: Secondary | ICD-10-CM | POA: Diagnosis not present

## 2021-12-16 DIAGNOSIS — Z8616 Personal history of COVID-19: Secondary | ICD-10-CM | POA: Insufficient documentation

## 2021-12-16 DIAGNOSIS — J9801 Acute bronchospasm: Secondary | ICD-10-CM | POA: Insufficient documentation

## 2021-12-16 DIAGNOSIS — N898 Other specified noninflammatory disorders of vagina: Secondary | ICD-10-CM | POA: Diagnosis present

## 2021-12-16 LAB — WET PREP, GENITAL
Clue Cells Wet Prep HPF POC: NONE SEEN
Sperm: NONE SEEN
Trich, Wet Prep: NONE SEEN
WBC, Wet Prep HPF POC: >=10 — AB (ref <10)

## 2021-12-16 MED ORDER — FLUCONAZOLE 150 MG PO TABS
150.0000 mg | ORAL_TABLET | Freq: Once | ORAL | Status: AC
  Administered 2021-12-16: 150 mg via ORAL
  Filled 2021-12-16: qty 1

## 2021-12-16 MED ORDER — AEROCHAMBER PLUS FLO-VU MISC
1.0000 | Freq: Once | Status: AC
  Administered 2021-12-16: 1
  Filled 2021-12-16: qty 1

## 2021-12-16 MED ORDER — FLUCONAZOLE 150 MG PO TABS: ORAL_TABLET | ORAL | 0 refills | Status: DC

## 2021-12-16 MED ORDER — ALBUTEROL SULFATE HFA 108 (90 BASE) MCG/ACT IN AERS
2.0000 | INHALATION_SPRAY | RESPIRATORY_TRACT | Status: DC | PRN
  Administered 2021-12-16: 2 via RESPIRATORY_TRACT
  Filled 2021-12-16: qty 6.7

## 2021-12-16 NOTE — ED Notes (Signed)
Pt oob to hall bathroom to provide urine sample at this time

## 2021-12-16 NOTE — ED Notes (Signed)
Patient given urine specimen cup and instructions for clean catch. Patient unable to provide sample at this time.

## 2021-12-16 NOTE — ED Provider Notes (Signed)
[facility] Provider Note: Georgena Spurling, MD, FACEP  CSN: [id number] MRN: [id number] ARRIVAL: 12/16/21 at 2126 ROOM: [place]  Vaginal Discharge   HISTORY OF PRESENT ILLNESS  12/16/21 10:53 PM Paula Haley is a 21 y.o. female with 5 days of vaginal discharge.  The discharge is white.  She tried Monistat 5 days ago and there was a transient improvement but it returned today.  She is having no dysuria or pain with this.  She is sexually active with a female partner.  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  She has been taking DayQuil and NyQuil without relief.  The cough has been occasionally productive of mucus.  She has had wheezing with this at times.  She has not had a fever or other cold symptoms.   Past Medical History:  Diagnosis Date   Anxiety    Dizziness    Headache    History of COVID-19 05/09/2020   Hydrocephalus [facility])    Pituitary tumor    Pseudotumor cerebri     Past Surgical History:  Procedure Laterality Date   ANTERIOR CRUCIATE LIGAMENT REPAIR Left 07/18/2020   Procedure: left knee ACL reconstruction,medial  meniscal repair, debridement, QUADRICEP AUTOGRAFT;  Surgeon: Meredith Pel, MD;  Location: [place];  Service: Orthopedics;  Laterality: Left;   SHOULDER CLOSED REDUCTION Left 09/09/2020   Procedure: LEFT KNEE MANIPULATION UNDER ANESTHESIA;  Surgeon: Meredith Pel, MD;  Location: [place];  Service: Orthopedics;  Laterality: Left;   VENTRICULOPERITONEAL SHUNT  2020    Family History  Problem Relation Age of Onset   Healthy Mother    Healthy Father     Social History   Tobacco Use   Smoking status: Former    Types: Cigarettes   Smokeless tobacco: Never   Tobacco comments:    Occasional cigarette smoker  Vaping Use   Vaping Use: Never used  Substance Use Topics   Alcohol use:  Never   Drug use: Yes    Frequency: 7.0 times per week    Types: Marijuana    Comment: daily    Prior to Admission medications   Medication Sig Start Date End Date Taking? Authorizing Provider  fluconazole (DIFLUCAN) 150 MG tablet Take in 3 days if symptoms of yeast infection persist. 12/16/21  Yes Linsie Lupo, MD  cholecalciferol (VITAMIN D3) 25 MCG (1000 UNIT) tablet Take 1,000 Units by mouth in the morning.    [provider]    Allergies Naproxen and Penicillins   REVIEW OF SYSTEMS  Negative except as noted here or in the History of Present Illness.   PHYSICAL EXAMINATION  Initial Vital Signs Blood pressure 109/83, pulse 87, temperature 98.2 F (36.8 C), temperature source Temporal, resp. rate 16, last menstrual period 12/05/2021, SpO2 99 %.  Examination General: Well-developed, well-nourished female in no acute distress; appearance consistent with age of record HENT: normocephalic; atraumatic Eyes: Normal appearance Neck: supple Heart: regular rate and rhythm Lungs: clear to auscultation bilaterally Abdomen: soft; nondistended; nontender; bowel sounds present GU: Normal external genitalia; physiologic appearing vaginal discharge; no vaginal bleeding; minimal cervical motion tenderness; no adnexal tenderness Extremities: No deformity; full range of motion Neurologic: Awake, alert and oriented; motor function intact in all extremities and symmetric; no facial droop Skin: Warm and dry Psychiatric: Normal mood and affect   RESULTS  Summary of this visit's results, reviewed and interpreted by myself:   EKG Interpretation  Date/Time:    Ventricular Rate:    PR Interval:    QRS Duration:   QT Interval:    QTC Calculation:   R Axis:     Text Interpretation:         Laboratory Studies: Results for orders placed or performed during the hospital encounter of 12/16/21 (from the past 24 hour(s))  Wet prep, genital     Status: Abnormal   Collection Time:  12/16/21 11:06 PM   Specimen: Cervix  Result Value Ref Range   Yeast Wet Prep HPF POC PRESENT (A) NONE SEEN   Trich, Wet Prep NONE SEEN NONE SEEN   Clue Cells Wet Prep HPF POC NONE SEEN NONE SEEN   WBC, Wet Prep HPF POC >=10 (A) <10   Sperm NONE SEEN    Imaging Studies: No results found.  ED COURSE and MDM  Nursing notes, initial and subsequent vitals signs, including pulse oximetry, reviewed and interpreted by myself.  Vitals:   12/16/21 2139 12/16/21 2336  BP: 109/83   Pulse: 87 65  Resp: 16 18  Temp: 98.2 F (36.8 C)   TempSrc: Temporal   SpO2: 99% 100%   Medications  albuterol (VENTOLIN HFA) 108 (90 Base) MCG/ACT inhaler 2 puff (2 puffs Inhalation Given 12/16/21 2339)  fluconazole (DIFLUCAN) tablet 150 mg (has no administration in time range)  aerochamber plus with mask device 1 each (1 each Other Given 12/16/21 2338)   Suspect the patient is having bronchospasm.  This is likely a combination of irritation from smoking (she was advised to quit) as well as cold exposure at work.  I do not appreciate any wheezing at the present time but we will provide her with an albuterol inhaler and AeroChamber and instruct her in their use.  11:44 PM Patient positive for yeast on wet prep.  We will treat with Diflucan.  PROCEDURES  Procedures   ED DIAGNOSES     ICD-10-CM   1. Candidal vulvovaginitis  B37.31     2. Bronchospasm  J98.01          Paula Haley, Jenny Reichmann, MD 12/16/21 2344

## 2021-12-16 NOTE — ED Notes (Signed)
Pt has returned from room- states was unable to void

## 2021-12-16 NOTE — ED Notes (Signed)
Pt oob re-attempting to void

## 2021-12-16 NOTE — ED Notes (Signed)
RT educated pt on proper use of MDI w/spacer. Pt able to perform w/out difficulty. RT educated pt on smoking cessation, pt states she vapes (educated on this also) Pt verbalizes understanding of teaching.

## 2021-12-16 NOTE — ED Triage Notes (Signed)
Vaginal discharge started Monday. White discharge. Tried monistat Monday discharge went away then came back today. Denies dysuria.  Also complains of a cough and wheeze for last 2 weeks, worse when smoking. States dayquil/nitequil isn't helping

## 2021-12-17 LAB — URINALYSIS, ROUTINE W REFLEX MICROSCOPIC
Glucose, UA: NEGATIVE mg/dL
Hgb urine dipstick: NEGATIVE
Ketones, ur: NEGATIVE mg/dL
Leukocytes,Ua: NEGATIVE
Nitrite: POSITIVE — AB
Protein, ur: NEGATIVE mg/dL
Specific Gravity, Urine: 1.017 (ref 1.005–1.030)
pH: 6 (ref 5.0–8.0)

## 2021-12-17 LAB — PREGNANCY, URINE: Preg Test, Ur: NEGATIVE

## 2021-12-17 NOTE — ED Notes (Signed)
Pt agreeable with d/c plan as discussed by provider - this nurse has verbally reinforced d/c instructions and provided pt with written copy- pt acknowledges verbal understanding and denies any additional questions, concerns, needs- pt ambulatory independently at d/c with steady gait; vitals stable; no distress.

## 2021-12-18 LAB — GC/CHLAMYDIA PROBE AMP (~~LOC~~) NOT AT ARMC
Chlamydia: NEGATIVE
Comment: NEGATIVE
Comment: NORMAL
Neisseria Gonorrhea: NEGATIVE

## 2022-01-12 ENCOUNTER — Emergency Department (HOSPITAL_BASED_OUTPATIENT_CLINIC_OR_DEPARTMENT_OTHER)
Admission: EM | Admit: 2022-01-12 | Discharge: 2022-01-12 | Disposition: A | Discharge status: Home / Self Care | Payer: No Typology Code available for payment source | Attending: Emergency Medicine | Admitting: Emergency Medicine

## 2022-01-12 ENCOUNTER — Other Ambulatory Visit: Payer: Self-pay

## 2022-01-12 ENCOUNTER — Emergency Department (HOSPITAL_BASED_OUTPATIENT_CLINIC_OR_DEPARTMENT_OTHER): Payer: No Typology Code available for payment source | Admitting: Radiology

## 2022-01-12 ENCOUNTER — Encounter (HOSPITAL_BASED_OUTPATIENT_CLINIC_OR_DEPARTMENT_OTHER): Payer: Self-pay | Admitting: Emergency Medicine

## 2022-01-12 DIAGNOSIS — R052 Subacute cough: Secondary | ICD-10-CM

## 2022-01-12 DIAGNOSIS — R300 Dysuria: Secondary | ICD-10-CM | POA: Insufficient documentation

## 2022-01-12 DIAGNOSIS — F17210 Nicotine dependence, cigarettes, uncomplicated: Secondary | ICD-10-CM | POA: Insufficient documentation

## 2022-01-12 DIAGNOSIS — N898 Other specified noninflammatory disorders of vagina: Secondary | ICD-10-CM | POA: Diagnosis present

## 2022-01-12 LAB — URINALYSIS, ROUTINE W REFLEX MICROSCOPIC
Bilirubin Urine: NEGATIVE
Glucose, UA: NEGATIVE mg/dL
Hgb urine dipstick: NEGATIVE
Ketones, ur: NEGATIVE mg/dL
Leukocytes,Ua: NEGATIVE
Nitrite: NEGATIVE
Protein, ur: NEGATIVE mg/dL
Specific Gravity, Urine: 1.02 (ref 1.005–1.030)
pH: 6.5 (ref 5.0–8.0)

## 2022-01-12 LAB — WET PREP, GENITAL
Clue Cells Wet Prep HPF POC: NONE SEEN
Sperm: NONE SEEN
Trich, Wet Prep: NONE SEEN
WBC, Wet Prep HPF POC: >=10 — AB (ref <10)
Yeast Wet Prep HPF POC: NONE SEEN

## 2022-01-12 LAB — HIV ANTIBODY (ROUTINE TESTING W REFLEX): HIV Screen 4th Generation wRfx: NONREACTIVE

## 2022-01-12 LAB — PREGNANCY, URINE: Preg Test, Ur: NEGATIVE

## 2022-01-12 MED ORDER — ALBUTEROL SULFATE HFA 108 (90 BASE) MCG/ACT IN AERS
2.0000 | INHALATION_SPRAY | RESPIRATORY_TRACT | Status: DC | PRN
  Filled 2022-01-12 (×2): qty 6.7

## 2022-01-12 MED ORDER — AEROCHAMBER PLUS FLO-VU MEDIUM MISC
1.0000 | Freq: Once | Status: DC
  Filled 2022-01-12: qty 1

## 2022-01-12 NOTE — Discharge Instructions (Addendum)
The pelvic exam did not show bacterial vaginosis or yeast.  Other tests are still pending.  You have been given an inhaler.  Follow-up with pulmonology as planned.

## 2022-01-12 NOTE — ED Notes (Signed)
Pelvic cart at bedside. 

## 2022-01-12 NOTE — ED Provider Notes (Signed)
Paula Haley Provider Note   CSN: [id number] Arrival date & time: 01/12/22  1511     History  Chief Complaint  Patient presents with   Vaginal Discharge    [name] is a 21 y.o. female.   Vaginal Discharge Patient presents with vaginal discharge.  States she has had some dysuria and vaginal discharge.  States it does not seem like a yeast infection.  Has had unprotected sex.  No vaginal pain.  No fevers. Also has had cough.  States has had since June.  Occasional asthma sputum production.  Has used inhalers with relief at times.  Has no history of asthma.  Has been seen at the [facility] with plans to follow-up with pulmonology but is not seeing them yet.  Currently smokes marijuana used to smoke cigarettes and vape.     Home Medications Prior to Admission medications   Medication Sig Start Date End Date Taking? Authorizing Provider  cholecalciferol (VITAMIN D3) 25 MCG (1000 UNIT) tablet Take 1,000 Units by mouth in the morning.    [provider]  fluconazole (DIFLUCAN) 150 MG tablet Take in 3 days if symptoms of yeast infection persist. 12/16/21   Molpus, Jenny Reichmann, MD      Allergies    Naproxen and Penicillins    Review of Systems   Review of Systems  Genitourinary:  Positive for vaginal discharge.    Physical Exam Updated Vital Signs BP 124/77 (BP Location: Right Arm)   Pulse 90   Temp 98.2 F (36.8 C) (Oral)   Resp 18   Ht '5\' 7"'$  (1.702 m)   Wt 68 kg   LMP 12/05/2021 (Approximate)   SpO2 100%   BMI 23.48 kg/m  Physical Exam Vitals and nursing note reviewed.  Eyes:     Pupils: Pupils are equal, round, and reactive to light.  Cardiovascular:     Rate and Rhythm: Normal rate.  Pulmonary:     Breath sounds: No wheezing or rhonchi.  Abdominal:     Tenderness: There is no abdominal tenderness.  Musculoskeletal:        General: No tenderness.  Neurological:     Mental Status: She is alert.     ED Results / Procedures /  Treatments   Labs (all labs ordered are listed, but only abnormal results are displayed) Labs Reviewed  WET PREP, GENITAL - Abnormal; Notable for the following components:      Result Value   WBC, Wet Prep HPF POC >=10 (*)    All other components within normal limits  URINALYSIS, ROUTINE W REFLEX MICROSCOPIC  PREGNANCY, URINE  RPR  HIV ANTIBODY (ROUTINE TESTING W REFLEX)  GC/CHLAMYDIA PROBE AMP ([place]) NOT AT [facility]    EKG None  Radiology DG Chest 2 View  Result Date: 01/12/2022 CLINICAL DATA:  Cough, congestion, and shortness of breath for 3 months. EXAM: CHEST - 2 VIEW COMPARISON:  Chest two views 11/12/2019 FINDINGS: Cardiac silhouette and mediastinal contours are within normal limits. The lungs are clear. No pleural effusion or pneumothorax. No acute skeletal abnormality. IMPRESSION: No active cardiopulmonary disease. Electronically Signed   By: Yvonne Kendall M.D.   On: 01/12/2022 17:31    Procedures Procedures    Medications Ordered in ED Medications  albuterol (VENTOLIN HFA) 108 (90 Base) MCG/ACT inhaler 2 puff (has no administration in time range)    ED Course/ Medical Decision Making/ A&P  Medical Decision Making Amount and/or Complexity of Data Reviewed Labs: ordered. Radiology: ordered.  Risk Prescription drug management.   Patient with cough.  Has had for 3 to 4 months.  We will get x-ray to evaluate.  Benign exam.  No asthma history but does smoke.  Also has vaginal discharge.  Has had both BV and yeast infection in the past.  Will get blood work and do pelvic exam.  Blood work reassuring.  Urine does not show infection.  Wet prep does have some white cells.  Benign pelvic exam that only showed some mild white vaginal discharge without cervical discharge..  No cervical motion tenderness.  No adnexal tenderness.  Will pend cultures but will not empirically treat at this time.  Given inhaler here.  Outpatient follow-up as  needed        Final Clinical Impression(s) / ED Diagnoses Final diagnoses:  Subacute cough  Vaginal discharge    Rx / DC Orders ED Discharge Orders     None         Davonna Belling, MD 01/12/22 [phone]

## 2022-01-12 NOTE — ED Triage Notes (Signed)
Needs to be seen for same as before vag d/c and ? UTI also states she has run out of inhaler

## 2022-01-13 LAB — RPR: RPR Ser Ql: NONREACTIVE

## 2022-01-15 LAB — GC/CHLAMYDIA PROBE AMP (~~LOC~~) NOT AT ARMC
Chlamydia: NEGATIVE
Comment: NEGATIVE
Comment: NORMAL
Neisseria Gonorrhea: NEGATIVE

## 2022-09-17 ENCOUNTER — Ambulatory Visit (HOSPITAL_COMMUNITY): Payer: Self-pay

## 2022-09-26 ENCOUNTER — Ambulatory Visit (HOSPITAL_COMMUNITY)
Admission: EM | Admit: 2022-09-26 | Discharge: 2022-09-26 | Disposition: A | Discharge status: Home / Self Care | Payer: No Typology Code available for payment source | Attending: Emergency Medicine | Admitting: Emergency Medicine

## 2022-09-26 ENCOUNTER — Ambulatory Visit (HOSPITAL_COMMUNITY): Payer: No Typology Code available for payment source

## 2022-09-26 ENCOUNTER — Encounter (HOSPITAL_COMMUNITY): Payer: Self-pay

## 2022-09-26 ENCOUNTER — Ambulatory Visit (HOSPITAL_COMMUNITY): Payer: Self-pay

## 2022-09-26 DIAGNOSIS — Z113 Encounter for screening for infections with a predominantly sexual mode of transmission: Secondary | ICD-10-CM | POA: Insufficient documentation

## 2022-09-26 DIAGNOSIS — K5900 Constipation, unspecified: Secondary | ICD-10-CM

## 2022-09-26 LAB — POCT URINALYSIS DIP (MANUAL ENTRY)
Bilirubin, UA: NEGATIVE
Blood, UA: NEGATIVE
Glucose, UA: NEGATIVE mg/dL
Ketones, POC UA: NEGATIVE mg/dL
Leukocytes, UA: NEGATIVE
Nitrite, UA: NEGATIVE
Protein Ur, POC: NEGATIVE mg/dL
Spec Grav, UA: 1.02 (ref 1.010–1.025)
Urobilinogen, UA: 0.2 E.U./dL
pH, UA: 7.5 (ref 5.0–8.0)

## 2022-09-26 LAB — POCT URINE PREGNANCY: Preg Test, Ur: NEGATIVE

## 2022-09-26 NOTE — ED Provider Notes (Signed)
[facility]    CSN: [id number] Arrival date & time: 09/26/22  1505    HISTORY   Chief Complaint  Patient presents with   Abdominal Pain   STD testing   HPI Paula Haley is a pleasant, 22 y.o. female who presents to urgent care today. Patient complains of abdominal pain and bloating sensation.  Patient states her stool has been green lately.  Patient denies pain with bowel movements.  Patient states she takes MiraLAX every day and is recently been referred to gastroenterologist due to frequent episodes of constipation, states her appointment is not for a few more months and she is very uncomfortable at this time.  Patient is also requesting testing for STDs, denies known exposure to STD, vaginal discharge, vaginal discomfort, genital lesion.  The history is provided by the patient.   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    Home Medications    Prior to  Admission medications   Medication Sig Start Date End Date Taking? Authorizing Provider  buPROPion (WELLBUTRIN XL) 150 MG 24 hr tablet Take 1 tablet by mouth every morning. 09/11/22  Yes [provider]  cholecalciferol (VITAMIN D3) 25 MCG (1000 UNIT) tablet Take 1,000 Units by mouth in the morning.    [provider]  fluconazole (DIFLUCAN) 150 MG tablet Take in 3 days if symptoms of yeast infection persist. 12/16/21   Molpus, John, MD    Family History Family History  Problem Relation Age of Onset   Healthy Mother    Healthy Father    Social History Social History   Tobacco Use   Smoking status: Former    Types: Cigarettes   Smokeless tobacco: Never   Tobacco comments:    Occasional cigarette smoker  Vaping Use   Vaping Use: Never used  Substance Use Topics   Alcohol use: Never   Drug use: Yes    Frequency: 7.0 times per week    Types: Marijuana    Comment: daily   Allergies   Naproxen and Penicillins  Review of Systems Review of Systems Pertinent findings revealed after performing a 14 point review of systems has been noted in the history of present illness.  Physical Exam Vital Signs BP (!) 133/93 (BP Location: Left Arm)   Pulse 81   Temp 98.1 F (36.7 C) (Oral)   Resp 18   LMP 08/25/2022   SpO2 98%   No data  found.  Physical Exam Vitals and nursing note reviewed.  Constitutional:      General: She is not in acute distress.    Appearance: Normal appearance. She is not ill-appearing.  HENT:     Head: Normocephalic and atraumatic.  Eyes:     General: Lids are normal.        Right eye: No discharge.        Left eye: No discharge.     Extraocular Movements: Extraocular movements intact.     Conjunctiva/sclera: Conjunctivae normal.     Right eye: Right conjunctiva is not injected.     Left eye: Left conjunctiva is not injected.  Neck:     Trachea: Trachea and phonation normal.  Cardiovascular:     Rate and Rhythm: Normal rate and  regular rhythm.     Pulses: Normal pulses.     Heart sounds: Normal heart sounds. No murmur heard.    No friction rub. No gallop.  Pulmonary:     Effort: Pulmonary effort is normal. No accessory muscle usage, prolonged expiration or respiratory distress.     Breath sounds: Normal breath sounds. No stridor, decreased air movement or transmitted upper airway sounds. No decreased breath sounds, wheezing, rhonchi or rales.  Chest:     Chest wall: No tenderness.  Abdominal:     General: Abdomen is flat. Bowel sounds are decreased.     Palpations: Abdomen is soft. There is no hepatomegaly or splenomegaly.     Tenderness: There is abdominal tenderness in the epigastric area, periumbilical area and left upper quadrant. There is no right CVA tenderness, left CVA tenderness, guarding or rebound.  Genitourinary:    Comments: Patient politely declines pelvic exam today, patient provided a vaginal swab for testing. Musculoskeletal:        General: Normal range of motion.     Cervical back: Normal range of motion and neck supple. Normal range of motion.  Lymphadenopathy:     Cervical: No cervical adenopathy.  Skin:    General: Skin is warm and dry.     Findings: No erythema or rash.  Neurological:     General: No focal deficit present.     Mental Status: She is alert and oriented to person, place, and time.  Psychiatric:        Mood and Affect: Mood normal.        Behavior: Behavior normal.     Visual Acuity Right Eye Distance:   Left Eye Distance:   Bilateral Distance:    Right Eye Near:   Left Eye Near:    Bilateral Near:     UC Couse / Diagnostics / Procedures:     Radiology DG Abd 1 View  Result Date: 09/26/2022 CLINICAL DATA:  Abdominal pain, bloating. EXAM: ABDOMEN - 1 VIEW COMPARISON:  None Available. FINDINGS: The bowel gas pattern is normal. Gaseous distention of the colonic loops. No radio-opaque calculi or other significant radiographic abnormality are seen. Lumbar shunt  catheter is again noted. IMPRESSION: Nonobstructive bowel gas pattern. Electronically Signed   By: Larose Hires D.O.   On: 09/26/2022 16:20    Procedures Procedures (including critical care time) EKG  Pending results:  Labs Reviewed  POCT URINALYSIS DIP (MANUAL ENTRY)  POCT URINE PREGNANCY  CERVICOVAGINAL ANCILLARY ONLY    Medications Ordered in UC: Medications - No data to display  UC Diagnoses / Final Clinical Impressions(s)   I have reviewed the triage vital signs and the nursing notes.  Pertinent labs &  imaging results that were available during my care of the patient were reviewed by me and considered in my medical decision making (see chart for details).    Final diagnoses:  Constipation, unspecified constipation type  Screening examination for STD (sexually transmitted disease)   STD screening was performed, patient advised that the results be posted to their MyChart and if any of the results are positive, they will be notified by phone, further treatment will be provided as indicated based on results of STD screening. Patient was advised to abstain from sexual intercourse until that they receive the results of their STD testing.  Patient was also advised to use condoms to protect themselves from STD exposure. Urinalysis today was normal. Urine pregnancy test was negative. Images of patient's abdominal x-ray were shared with patient during her visit today.   Patient was advised to purchase and take a bottle of magnesium citrate to help produce a large bowel movement and hopefully relieve her abdominal pain.   Patient was advised to the emergency room for worsening abdominal pain. Return precautions advised.  Drug allergies reviewed, all questions addressed.  Please see discharge instructions below for details of plan of care as provided to patient. ED Prescriptions   None    PDMP not reviewed this encounter.  Disposition Upon Discharge:  Condition: stable for discharge  home  Patient presented with concern for an acute illness with associated systemic symptoms and significant discomfort requiring urgent management. In my opinion, this is a condition that a prudent lay person (someone who possesses an average knowledge of health and medicine) may potentially expect to result in complications if not addressed urgently such as respiratory distress, impairment of bodily function or dysfunction of bodily organs.   As such, the patient has been evaluated and assessed, work-up was performed and treatment was provided in alignment with urgent care protocols and evidence based medicine.  Patient/parent/caregiver has been advised that the patient may require follow up for further testing and/or treatment if the symptoms continue in spite of treatment, as clinically indicated and appropriate.  Routine symptom specific, illness specific and/or disease specific instructions were discussed with the patient and/or caregiver at length.  Prevention strategies for avoiding STD exposure were also discussed.  The patient will follow up with their current PCP if and as advised. If the patient does not currently have a PCP we will assist them in obtaining one.   The patient may need specialty follow up if the symptoms continue, in spite of conservative treatment and management, for further workup, evaluation, consultation and treatment as clinically indicated and appropriate.  Patient/parent/caregiver verbalized understanding and agreement of plan as discussed.  All questions were addressed during visit.  Please see discharge instructions below for further details of plan.  Discharge Instructions:   Discharge Instructions      The x-ray of your abdomen is concerning for significant amount of retained stool in your bowels which is causing a buildup of an also significant amount of gas which is the reason you are feeling bloated and uncomfortable at this time.  I recommend that you  purchase a bottle of magnesium citrate at your pharmacy and, when it is convenient for you, drink the entire bottle which will produce a very large bowel movement within a few hours.  This should relieve all of your abdominal discomfort.  If it does not, please go to the emergency room for further evaluation of possible intestinal blockage which cannot be appreciated on abdominal x-ray.  The  results of your vaginal swab test which screens for BV, yeast, gonorrhea, chlamydia and trichomonas will be made posted to your MyChart account once it is complete.  This typically takes 2 to 4 days.  Please abstain from sexual intercourse of any kind, vaginal, oral or anal, until you have received the results of your STD testing.     If any of your results are abnormal, you will receive a phone call regarding treatment.  Prescriptions, if any are needed, will be provided for you at your pharmacy.     Your urine pregnancy test today is negative.   Our point-of-care analysis of your urine sample today was normal and did not reveal any concern for urinary tract infection.   If you have not had complete resolution of your symptoms after completing any recommended treatment or if your symptoms worsen, please return for repeat evaluation.   Thank you for visiting urgent care today.  I appreciate the opportunity to participate in your care.       This office note has been dictated using Teaching laboratory technician.  Unfortunately, this method of dictation can sometimes lead to typographical or grammatical errors.  I apologize for your inconvenience in advance if this occurs.  Please do not hesitate to reach out to me if clarification is needed.       Theadora Rama Scales, PA-C 09/26/22 1627

## 2022-09-26 NOTE — ED Triage Notes (Signed)
Here for abdominal pain and bloating. Pt reports she is having green stools. Pt would like STD-testing today.

## 2022-09-26 NOTE — Discharge Instructions (Addendum)
The x-ray of your abdomen is concerning for significant amount of retained stool in your bowels which is causing a buildup of an also significant amount of gas which is the reason you are feeling bloated and uncomfortable at this time.  I recommend that you purchase a bottle of magnesium citrate at your pharmacy and, when it is convenient for you, drink the entire bottle which will produce a very large bowel movement within a few hours.  This should relieve all of your abdominal discomfort.  If it does not, please go to the emergency room for further evaluation of possible intestinal blockage which cannot be appreciated on abdominal x-ray.  The results of your vaginal swab test which screens for BV, yeast, gonorrhea, chlamydia and trichomonas will be made posted to your MyChart account once it is complete.  This typically takes 2 to 4 days.  Please abstain from sexual intercourse of any kind, vaginal, oral or anal, until you have received the results of your STD testing.     If any of your results are abnormal, you will receive a phone call regarding treatment.  Prescriptions, if any are needed, will be provided for you at your pharmacy.     Your urine pregnancy test today is negative.   Our point-of-care analysis of your urine sample today was normal and did not reveal any concern for urinary tract infection.   If you have not had complete resolution of your symptoms after completing any recommended treatment or if your symptoms worsen, please return for repeat evaluation.   Thank you for visiting urgent care today.  I appreciate the opportunity to participate in your care.

## 2022-09-27 LAB — CERVICOVAGINAL ANCILLARY ONLY
Bacterial Vaginitis (gardnerella): NEGATIVE
Candida Glabrata: NEGATIVE
Candida Vaginitis: NEGATIVE
Chlamydia: NEGATIVE
Comment: NEGATIVE
Comment: NEGATIVE
Comment: NEGATIVE
Comment: NEGATIVE
Comment: NEGATIVE
Comment: NORMAL
Neisseria Gonorrhea: NEGATIVE
Trichomonas: NEGATIVE

## 2022-09-28 ENCOUNTER — Other Ambulatory Visit: Payer: Self-pay

## 2022-09-28 ENCOUNTER — Ambulatory Visit (HOSPITAL_COMMUNITY)
Admission: EM | Admit: 2022-09-28 | Discharge: 2022-09-28 | Disposition: A | Discharge status: Home / Self Care | Payer: No Typology Code available for payment source | Attending: Family Medicine | Admitting: Family Medicine

## 2022-09-28 ENCOUNTER — Ambulatory Visit (HOSPITAL_COMMUNITY): Payer: Self-pay

## 2022-09-28 ENCOUNTER — Encounter (HOSPITAL_COMMUNITY): Payer: Self-pay | Admitting: Emergency Medicine

## 2022-09-28 DIAGNOSIS — K5900 Constipation, unspecified: Secondary | ICD-10-CM

## 2022-09-28 DIAGNOSIS — N910 Primary amenorrhea: Secondary | ICD-10-CM

## 2022-09-28 MED ORDER — LACTULOSE 10 GM/15ML PO SOLN: 10.0000 g | Freq: Two times a day (BID) | ORAL | 0 refills | Status: DC

## 2022-09-28 NOTE — ED Triage Notes (Signed)
Pt here 2 days ago for constipation.  Did magensium citrate, enema X 2.  Has had two small bowel movements. Still feels bloated.  Mild nausea.  Wants to see if still constipated because feels like did not work right.

## 2022-09-28 NOTE — ED Provider Notes (Signed)
[facility]    CSN: [id number] Arrival date & time: 09/28/22  1254      History   Chief Complaint Chief Complaint  Patient presents with   Constipation    HPI Paula Haley is a 22 y.o. female.    Constipation  Here for trouble with constipation and some abdominal pain.  She was seen here May 22 and gave history that she had been constipated and had abdominal pain.  She has been referred to gastroenterology in the [place] system, so will not see them for about 2 months.  On May 22, her UPT was negative, her urinalysis was benign, and she had a good bit of stool on her abdominal x-ray.  No vomiting, but she has had some nausea.  No blood in the stool. She was prescribed mag citrate, and states that she had a small bowel movement, and then she used some enemas and still is not sure if she got good results.  Then she tells me that she was actually having bowel movements daily before she was seen here on the 22nd.  That was while she was taking MiraLAX.  Apparently she had been constipated before start MiraLAX  Also last menstrual period was April 20.  Again UPT was -2 days ago. 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    Past Surgical History:  Procedure Laterality Date   ANTERIOR CRUCIATE LIGAMENT REPAIR Left 07/18/2020   Procedure: left knee ACL reconstruction,medial  meniscal repair, debridement, QUADRICEP AUTOGRAFT;  Surgeon: Cammy Copa, MD;  Location: [facility];  Service: Orthopedics;   Laterality: Left;   SHOULDER CLOSED REDUCTION Left 09/09/2020   Procedure: LEFT KNEE MANIPULATION UNDER ANESTHESIA;  Surgeon: Cammy Copa, MD;  Location: [facility] OR;  Service: Orthopedics;  Laterality: Left;   VENTRICULOPERITONEAL SHUNT  2020    OB History   No obstetric history on file.      Home Medications    Prior to Admission medications   Medication Sig Start Date End Date Taking? Authorizing Provider  buPROPion (WELLBUTRIN XL) 150 MG 24 hr tablet Take 1 tablet by mouth every morning. 09/11/22  Yes [provider]  cholecalciferol (VITAMIN D3) 25 MCG (1000 UNIT) tablet Take 1,000 Units by mouth in the morning.   Yes [provider]  lactulose (CHRONULAC) 10 GM/15ML solution Take 15 mLs (10 g total) by mouth 2 (two) times daily. 09/28/22  Yes Sumayah Bearse, Janace Aris, MD    Family History Family History  Problem Relation Age of Onset   Healthy Mother    Healthy Father     Social History Social History   Tobacco Use   Smoking status: Former    Types: Cigarettes   Smokeless tobacco: Never   Tobacco comments:    Occasional cigarette smoker  Vaping Use   Vaping Use: Never used  Substance Use Topics   Alcohol use: Never   Drug use: Yes  Frequency: 7.0 times per week    Types: Marijuana    Comment: daily     Allergies   Naproxen and Penicillins   Review of Systems Review of Systems  Gastrointestinal:  Positive for constipation.     Physical Exam Triage Vital Signs ED Triage Vitals  Enc Vitals Group     BP 09/28/22 1337 118/79     Pulse Rate 09/28/22 1337 85     Resp 09/28/22 1337 14     Temp 09/28/22 1337 98.9 F (37.2 C)     Temp Source 09/28/22 1337 Oral     SpO2 09/28/22 1337 97 %     Weight 09/28/22 1339 150 lb (68 kg)     Height 09/28/22 1339 5\' 7"  (1.702 m)     Head Circumference --      Peak Flow --      Pain Score 09/28/22 1338 6     Pain Loc --      Pain Edu? --      Excl. in GC? --    No data found.  Updated Vital  Signs BP 118/79   Pulse 85   Temp 98.9 F (37.2 C) (Oral)   Resp 14   Ht 5\' 7"  (1.702 m)   Wt 68 kg   LMP 08/25/2022   SpO2 97%   BMI 23.49 kg/m   Visual Acuity Right Eye Distance:   Left Eye Distance:   Bilateral Distance:    Right Eye Near:   Left Eye Near:    Bilateral Near:     Physical Exam Vitals reviewed.  Constitutional:      General: She is not in acute distress.    Appearance: She is not ill-appearing, toxic-appearing or diaphoretic.  HENT:     Mouth/Throat:     Mouth: Mucous membranes are moist.  Eyes:     Extraocular Movements: Extraocular movements intact.     Pupils: Pupils are equal, round, and reactive to light.  Cardiovascular:     Rate and Rhythm: Normal rate and regular rhythm.  Pulmonary:     Effort: Pulmonary effort is normal.     Breath sounds: Normal breath sounds.  Abdominal:     General: There is no distension.     Palpations: Abdomen is soft. There is no mass.     Tenderness: There is no abdominal tenderness. There is no guarding.  Musculoskeletal:     Cervical back: Neck supple.  Lymphadenopathy:     Cervical: No cervical adenopathy.  Skin:    Coloration: Skin is not jaundiced or pale.  Neurological:     General: No focal deficit present.     Mental Status: She is alert and oriented to person, place, and time.  Psychiatric:        Behavior: Behavior normal.      UC Treatments / Results  Labs (all labs ordered are listed, but only abnormal results are displayed) Labs Reviewed  HCG, SERUM, QUALITATIVE    EKG   Radiology DG Abd 1 View  Result Date: 09/26/2022 CLINICAL DATA:  Abdominal pain, bloating. EXAM: ABDOMEN - 1 VIEW COMPARISON:  None Available. FINDINGS: The bowel gas pattern is normal. Gaseous distention of the colonic loops. No radio-opaque calculi or other significant radiographic abnormality are seen. Lumbar shunt catheter is again noted. IMPRESSION: Nonobstructive bowel gas pattern. Electronically Signed   By:  Larose Hires D.O.   On: 09/26/2022 16:20    Procedures Procedures (including critical care time)  Medications Ordered  in UC Medications - No data to display  Initial Impression / Assessment and Plan / UC Course  I have reviewed the triage vital signs and the nursing notes.  Pertinent labs & imaging results that were available during my care of the patient were reviewed by me and considered in my medical decision making (see chart for details).     I have discussed with her that I do not think is a good idea to repeatedly image her abdomen with her being of childbearing age.  Since her period is a little delayed, serum pregnancy test is drawn today.  Will notify her if it is positive.  I am sending in lactulose for her to take.  [facility]  Also if she is worse she is to go to the ER Final Clinical Impressions(s) / UC Diagnoses   Final diagnoses:  Constipation, unspecified constipation type  Delayed menses     Discharge Instructions      We have drawn blood to check a pregnancy test.  We will notify you if it is positive.  Lactulose 10 g /15 mL--15 mL or 1 tablespoon by mouth 2 times daily for constipation  If you are worse please proceed to the emergency room     ED Prescriptions     Medication Sig Dispense Auth. Provider   lactulose (CHRONULAC) 10 GM/15ML solution Take 15 mLs (10 g total) by mouth 2 (two) times daily. 236 mL Zenia Resides, MD      PDMP not reviewed this encounter.   Zenia Resides, MD 09/28/22 [phone]

## 2022-09-28 NOTE — Discharge Instructions (Signed)
We have drawn blood to check a pregnancy test.  We will notify you if it is positive.  Lactulose 10 g /15 mL--15 mL or 1 tablespoon by mouth 2 times daily for constipation  If you are worse please proceed to the emergency room

## 2022-10-21 ENCOUNTER — Ambulatory Visit
Admission: EM | Admit: 2022-10-21 | Discharge: 2022-10-21 | Disposition: A | Discharge status: Home / Self Care | Payer: No Typology Code available for payment source | Attending: Nurse Practitioner | Admitting: Nurse Practitioner

## 2022-10-21 DIAGNOSIS — Z711 Person with feared health complaint in whom no diagnosis is made: Secondary | ICD-10-CM | POA: Diagnosis not present

## 2022-10-21 DIAGNOSIS — K5900 Constipation, unspecified: Secondary | ICD-10-CM | POA: Diagnosis not present

## 2022-10-21 LAB — POCT URINALYSIS DIP (MANUAL ENTRY)
Bilirubin, UA: NEGATIVE
Blood, UA: NEGATIVE
Glucose, UA: NEGATIVE mg/dL
Ketones, POC UA: NEGATIVE mg/dL
Leukocytes, UA: NEGATIVE
Nitrite, UA: NEGATIVE
Protein Ur, POC: NEGATIVE mg/dL
Spec Grav, UA: 1.025 (ref 1.010–1.025)
Urobilinogen, UA: 0.2 E.U./dL
pH, UA: 7 (ref 5.0–8.0)

## 2022-10-21 LAB — POCT URINE PREGNANCY: Preg Test, Ur: NEGATIVE

## 2022-10-21 NOTE — ED Provider Notes (Signed)
[facility]    CSN: [id number] Arrival date & time: 10/21/22  1125      History   Chief Complaint Chief Complaint  Patient presents with   Abdominal Pain    HPI Paula Haley is a 22 y.o. female presents for late menses and abd pain.  Patient reports she uses a flow tracker on her phone and states it told her that she was late.  She states her last period started on May 26.  In addition she reports some lower abdominal pain and nausea for a week.  She does have a history of constipation and was seen twice in May in urgent care for this complaint.  At that time she advised to take lactulose.  She does have an appointment with GI in July.  She states she has a bowel movement about 3 times a week.  She is not currently taking any laxatives.  She states she is drinking a lot of water and apple juice.  She states she eats a lot of pizza rolls, sandwiches, and soups.  Denies any fevers, dysuria, vomiting, diarrhea, flank pain.  No other concerns at this time.   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    Past Surgical History:  Procedure Laterality Date   ANTERIOR CRUCIATE LIGAMENT REPAIR Left 07/18/2020   Procedure: left knee ACL reconstruction,medial  meniscal repair, debridement, QUADRICEP AUTOGRAFT;  Surgeon: Cammy Copa, MD;  Location: [facility];  Service: Orthopedics;  Laterality: Left;   SHOULDER CLOSED REDUCTION Left 09/09/2020   Procedure: LEFT KNEE MANIPULATION UNDER ANESTHESIA;  Surgeon: Cammy Copa, MD;  Location: [facility] OR;  Service: Orthopedics;  Laterality: Left;   VENTRICULOPERITONEAL SHUNT  2020    OB History   No obstetric history on file.      Home Medications    Prior to Admission medications   Medication Sig Start Date End Date Taking? Authorizing Provider  buPROPion (WELLBUTRIN XL) 150 MG 24 hr tablet Take 1 tablet by mouth every morning. 09/11/22   [provider]  cholecalciferol (VITAMIN D3) 25 MCG (1000 UNIT) tablet Take 1,000 Units by mouth in the morning.    [provider]  lactulose (CHRONULAC) 10 GM/15ML solution Take 15 mLs (10 g total) by mouth 2 (two) times daily. 09/28/22   Zenia Resides, MD    Family History Family History  Problem Relation Age of Onset   Healthy Mother    Healthy Father     Social History Social History   Tobacco Use   Smoking status: Former    Types: Cigarettes   Smokeless tobacco: Never   Tobacco comments:    Occasional cigarette smoker  Vaping Use   Vaping Use: Never used  Substance Use Topics   Alcohol use: Never   Drug use: Yes    Frequency: 7.0 times per week    Types: Marijuana    Comment: daily     Allergies  Naproxen and Penicillins   Review of Systems Review of Systems  Gastrointestinal:  Positive for abdominal pain.  Genitourinary:  Positive for menstrual problem.     Physical Exam Triage Vital Signs ED Triage Vitals  Enc Vitals Group     BP 10/21/22 1133 133/87     Pulse Rate 10/21/22 1133 80     Resp 10/21/22 1133 15     Temp 10/21/22 1133 97.6 F (36.4 C)     Temp Source 10/21/22 1133 Oral     SpO2 10/21/22 1133 98 %     Weight --      Height --      Head Circumference --      Peak Flow --      Pain Score 10/21/22 1130 4     Pain Loc --      Pain Edu? --      Excl. in GC? --    No data found.  Updated Vital Signs BP 133/87   Pulse 80   Temp 97.6 F (36.4 C) (Oral)   Resp 15   LMP 10/01/2022 (Exact Date)   SpO2 98%   Visual Acuity Right  Eye Distance:   Left Eye Distance:   Bilateral Distance:    Right Eye Near:   Left Eye Near:    Bilateral Near:     Physical Exam Vitals and nursing note reviewed.  Constitutional:      Appearance: Normal appearance.  HENT:     Head: Normocephalic and atraumatic.  Eyes:     Pupils: Pupils are equal, round, and reactive to light.  Cardiovascular:     Rate and Rhythm: Normal rate.  Pulmonary:     Effort: Pulmonary effort is normal.  Abdominal:     General: Bowel sounds are normal.     Palpations: Abdomen is soft. There is no hepatomegaly or splenomegaly.     Tenderness: There is generalized abdominal tenderness. Negative signs include Rovsing's sign and McBurney's sign.  Skin:    General: Skin is warm and dry.  Neurological:     General: No focal deficit present.     Mental Status: She is alert and oriented to person, place, and time.  Psychiatric:        Mood and Affect: Mood is anxious.      UC Treatments / Results  Labs (all labs ordered are listed, but only abnormal results are displayed) Labs Reviewed  POCT URINALYSIS DIP (MANUAL ENTRY) - Abnormal; Notable for the following components:      Result Value   Clarity, UA cloudy (*)    All other components within normal limits  POCT URINE PREGNANCY    EKG   Radiology No results found.  Procedures Procedures (including critical care time)  Medications Ordered in UC Medications - No data to display  Initial Impression / Assessment and Plan / UC Course  I have reviewed the triage vital signs and the nursing notes.  Pertinent labs & imaging results that were available during my care of the patient were reviewed by me and considered in my medical decision making (see chart for details).     Reviewed exam and symptoms with patient.  Patient is well-appearing and in no acute distress.  Does appear slightly anxious.  Urine negative for UTI.  hCG negative.  Discussed with patient that her menses is not late as  her last menstrual period started on May 26.  Advised her that some of these apps to monitor her periods  are not necessarily accurate. Discussed abdominal pain reported likely secondary to constipation.  No red flags on exam.  Discussed continuing to increase fluids and add a fiber supplement and to take MiraLAX as needed.  Discussed that she will need to see her gastroenterologist in July to get to the root of the issue.  I did review ER precautions with her and she verbalized understanding Advised PCP follow-up 1 week for recheck Final Clinical Impressions(s) / UC Diagnoses   Final diagnoses:  None   Discharge Instructions   None    ED Prescriptions   None    PDMP not reviewed this encounter.   Radford Pax, NP 10/21/22 1213

## 2022-10-21 NOTE — ED Triage Notes (Signed)
Pt presents with c/o late cycle and nausea x 1 week. Pt states lower abdominal pain.   Pt states she usually does not have cramps before her period.

## 2022-10-21 NOTE — Discharge Instructions (Addendum)
Patient declined AVS 

## 2022-10-22 ENCOUNTER — Inpatient Hospital Stay: Admission: RE | Admit: 2022-10-22 | Payer: Self-pay | Source: Ambulatory Visit

## 2022-10-23 ENCOUNTER — Emergency Department (HOSPITAL_COMMUNITY)
Admission: EM | Admit: 2022-10-23 | Discharge: 2022-10-23 | Discharge status: Against Medical Advice | Payer: No Typology Code available for payment source | Attending: Emergency Medicine | Admitting: Emergency Medicine

## 2022-10-23 ENCOUNTER — Encounter (HOSPITAL_COMMUNITY): Payer: Self-pay

## 2022-10-23 ENCOUNTER — Other Ambulatory Visit: Payer: Self-pay

## 2022-10-23 DIAGNOSIS — K5900 Constipation, unspecified: Secondary | ICD-10-CM | POA: Diagnosis present

## 2022-10-23 DIAGNOSIS — Z5321 Procedure and treatment not carried out due to patient leaving prior to being seen by health care provider: Secondary | ICD-10-CM | POA: Insufficient documentation

## 2022-10-23 LAB — COMPREHENSIVE METABOLIC PANEL
ALT: 15 U/L (ref 0–44)
AST: 22 U/L (ref 15–41)
Albumin: 3.9 g/dL (ref 3.5–5.0)
Alkaline Phosphatase: 58 U/L (ref 38–126)
Anion gap: 11 (ref 5–15)
BUN: 16 mg/dL (ref 6–20)
CO2: 21 mmol/L — ABNORMAL LOW (ref 22–32)
Calcium: 9.3 mg/dL (ref 8.9–10.3)
Chloride: 106 mmol/L (ref 98–111)
Creatinine, Ser: 0.83 mg/dL (ref 0.44–1.00)
GFR, Estimated: >60 mL/min (ref >60)
Glucose, Bld: 93 mg/dL (ref 70–99)
Potassium: 4 mmol/L (ref 3.5–5.1)
Sodium: 138 mmol/L (ref 135–145)
Total Bilirubin: 0.7 mg/dL (ref 0.3–1.2)
Total Protein: 7 g/dL (ref 6.5–8.1)

## 2022-10-23 LAB — URINALYSIS, ROUTINE W REFLEX MICROSCOPIC
Bilirubin Urine: NEGATIVE
Glucose, UA: NEGATIVE mg/dL
Hgb urine dipstick: NEGATIVE
Ketones, ur: NEGATIVE mg/dL
Leukocytes,Ua: NEGATIVE
Nitrite: NEGATIVE
Protein, ur: NEGATIVE mg/dL
Specific Gravity, Urine: 1.025 (ref 1.005–1.030)
pH: 7 (ref 5.0–8.0)

## 2022-10-23 LAB — CBC
HCT: 41.7 % (ref 36.0–46.0)
Hemoglobin: 14 g/dL (ref 12.0–15.0)
MCH: 29.9 pg (ref 26.0–34.0)
MCHC: 33.6 g/dL (ref 30.0–36.0)
MCV: 88.9 fL (ref 80.0–100.0)
Platelets: 275 10*3/uL (ref 150–400)
RBC: 4.69 MIL/uL (ref 3.87–5.11)
RDW: 13 % (ref 11.5–15.5)
WBC: 7.7 10*3/uL (ref 4.0–10.5)
nRBC: 0 % (ref 0.0–0.2)

## 2022-10-23 LAB — I-STAT BETA HCG BLOOD, ED (MC, WL, AP ONLY): I-stat hCG, quantitative: <5 m[IU]/mL (ref <5)

## 2022-10-23 LAB — LIPASE, BLOOD: Lipase: 43 U/L (ref 11–51)

## 2022-10-23 NOTE — ED Notes (Signed)
This tech called for the patient's name three times in the waiting room with no response.

## 2022-10-23 NOTE — ED Triage Notes (Addendum)
Pt reports generalized abd pain that radiates to her back. Pt was seen at UC 2 weeks ago and was told she was constipated. Pt taking laxatives without any relief. Pt also reports some nausea now. Last BM was this morning but was small.

## 2022-10-23 NOTE — ED Provider Triage Note (Signed)
Emergency Medicine Provider Triage Evaluation Note  Paula Haley , a 22 y.o. female  was evaluated in triage.  Pt complains of constipation for the past few days to weeks.  Patient reports that she is able to have a small bowel movement however does not have a large bowel movement.  She reports that she still able to pass some gas.  She was seen at urgent care and was told to do mag citrate and lactulose.  She reports that she tried these once dose but did not work for her so she stopped taking them.  Reports sometimes some cramping pain but none now.  No nausea or vomiting.  Review of Systems  Positive:  Negative:   Physical Exam  BP 121/78 (BP Location: Right Arm)   Pulse 86   Temp 98.8 F (37.1 C)   Resp 18   LMP 09/28/2022 (Approximate)   SpO2 99%  Gen:   Awake, no distress   Resp:  Normal effort  MSK:   Moves extremities without difficulty  Other:  Abdomen soft, nontender.  Nondistended.  Medical Decision Making  Medically screening exam initiated at 9:17 PM.  Appropriate orders placed.  Paula Haley was informed that the remainder of the evaluation will be completed by another provider, this initial triage assessment does not replace that evaluation, and the importance of remaining in the ED until their evaluation is complete.  Order 1 view x-ray.  Labs ordered by nursing staff.   Achille Rich, PA-C 10/23/22 2121

## 2023-01-27 ENCOUNTER — Emergency Department (HOSPITAL_BASED_OUTPATIENT_CLINIC_OR_DEPARTMENT_OTHER)
Admission: EM | Admit: 2023-01-27 | Discharge: 2023-01-27 | Disposition: A | Discharge status: Home / Self Care | Payer: No Typology Code available for payment source | Attending: Emergency Medicine | Admitting: Emergency Medicine

## 2023-01-27 ENCOUNTER — Encounter (HOSPITAL_BASED_OUTPATIENT_CLINIC_OR_DEPARTMENT_OTHER): Payer: Self-pay

## 2023-01-27 ENCOUNTER — Other Ambulatory Visit: Payer: Self-pay

## 2023-01-27 DIAGNOSIS — A64 Unspecified sexually transmitted disease: Secondary | ICD-10-CM | POA: Insufficient documentation

## 2023-01-27 DIAGNOSIS — R21 Rash and other nonspecific skin eruption: Secondary | ICD-10-CM | POA: Insufficient documentation

## 2023-01-27 DIAGNOSIS — Z202 Contact with and (suspected) exposure to infections with a predominantly sexual mode of transmission: Secondary | ICD-10-CM | POA: Diagnosis not present

## 2023-01-27 DIAGNOSIS — N898 Other specified noninflammatory disorders of vagina: Secondary | ICD-10-CM | POA: Diagnosis present

## 2023-01-27 HISTORY — DX: Endometriosis, unspecified: N80.9

## 2023-01-27 LAB — WET PREP, GENITAL
Clue Cells Wet Prep HPF POC: NONE SEEN
Sperm: NONE SEEN
Trich, Wet Prep: NONE SEEN
WBC, Wet Prep HPF POC: <10 (ref <10)
Yeast Wet Prep HPF POC: NONE SEEN

## 2023-01-27 LAB — URINALYSIS, ROUTINE W REFLEX MICROSCOPIC
Bilirubin Urine: NEGATIVE
Glucose, UA: NEGATIVE mg/dL
Hgb urine dipstick: NEGATIVE
Ketones, ur: NEGATIVE mg/dL
Nitrite: NEGATIVE
Protein, ur: NEGATIVE mg/dL
Specific Gravity, Urine: 1.016 (ref 1.005–1.030)
pH: 6.5 (ref 5.0–8.0)

## 2023-01-27 LAB — RAPID HIV SCREEN (HIV 1/2 AB+AG)
HIV 1/2 Antibodies: NONREACTIVE
HIV-1 P24 Antigen - HIV24: NONREACTIVE

## 2023-01-27 LAB — PREGNANCY, URINE: Preg Test, Ur: NEGATIVE

## 2023-01-27 MED ORDER — CEFIXIME 400 MG PO CAPS: 800.0000 mg | ORAL_CAPSULE | Freq: Every day | ORAL | 0 refills | Status: DC

## 2023-01-27 MED ORDER — DOXYCYCLINE HYCLATE 100 MG PO CAPS: 100.0000 mg | ORAL_CAPSULE | Freq: Two times a day (BID) | ORAL | 0 refills | Status: AC

## 2023-01-27 NOTE — Discharge Instructions (Signed)
You are seen in the emergency department today for concerns of sexually transmitted infection.  Your labs are thankfully reassuring and there is no evidence of bacterial vaginosis or yeast infection.  Your HIV test is also negative.  However given that you are currently having symptoms, I would recommended initiating treatment for gonorrhea and chlamydia.  Please plan on following up with your primary care provider for repeat assessment.

## 2023-01-27 NOTE — ED Notes (Signed)
Pt instructed to doff clothes and don gown. Pt unable to void at this time.

## 2023-01-27 NOTE — ED Provider Notes (Signed)
  EMERGENCY DEPARTMENT AT [facility] Provider Note   CSN: [id number] Arrival date & time: 01/27/23  1345     History Chief Complaint  Patient presents with   [facility] TRANSMITTED DISEASE    Paula Haley is a 22 y.o. female.  Patient presents to the emergency department concerns of STIs.  She reports that she is here for STI testing patient is that her partner has multiple other sexual partners and she has developed a rash on her external labia as well as some vaginal discharge.  Denies any dysuria or hematuria but does report that the discharge is irritating and uncomfortable.  States that symptoms began today.  Denies any significant abdominal pain or tenderness, nausea, vomiting, fevers.  No prior history of pelvic inflammatory disease.  HPI     Home Medications Prior to Admission medications   Medication Sig Start Date End Date Taking? Authorizing Provider  cefixime (SUPRAX) 400 MG CAPS capsule Take 2 capsules (800 mg total) by mouth daily. 01/27/23  Yes Smitty Knudsen, PA-C  doxycycline (VIBRAMYCIN) 100 MG capsule Take 1 capsule (100 mg total) by mouth 2 (two) times daily for 7 days. 01/27/23 02/03/23 Yes Smitty Knudsen, PA-C  buPROPion (WELLBUTRIN XL) 150 MG 24 hr tablet Take 1 tablet by mouth every morning. 09/11/22   [provider]  cholecalciferol (VITAMIN D3) 25 MCG (1000 UNIT) tablet Take 1,000 Units by mouth in the morning.    [provider]  lactulose (CHRONULAC) 10 GM/15ML solution Take 15 mLs (10 g total) by mouth 2 (two) times daily. 09/28/22   Zenia Resides, MD      Allergies    Naproxen and Penicillins    Review of Systems   Review of Systems  Genitourinary:  Positive for vaginal discharge.  Skin:  Positive for rash.  All other systems reviewed and are negative.   Physical Exam Updated Vital Signs BP 126/88 (BP Location: Left Arm)   Pulse 71   Temp 97.8 F (36.6 C)   Resp 16   Ht 5\' 7"  (1.702 m)   Wt 70.3 kg    LMP 12/29/2022   SpO2 100%   BMI 24.28 kg/m  Physical Exam Vitals and nursing note reviewed. Exam conducted with a chaperone present.  HENT:     Head: Normocephalic and atraumatic.  Eyes:     General: No scleral icterus.       Right eye: No discharge.        Left eye: No discharge.  Cardiovascular:     Rate and Rhythm: Normal rate and regular rhythm.  Genitourinary:    General: Normal vulva.     Pubic Area: Rash present.     Labia:        Right: Rash present.        Left: Rash present.      Vagina: Vaginal discharge present.     Comments: Folliculitis appearing rash on the pubis. Some redness and irritated skin along the external labia. No evidence of genital herpes at this time. Skin:    Findings: No rash.     ED Results / Procedures / Treatments   Labs (all labs ordered are listed, but only abnormal results are displayed) Labs Reviewed  URINALYSIS, ROUTINE W REFLEX MICROSCOPIC - Abnormal; Notable for the following components:      Result Value   Leukocytes,Ua SMALL (*)    Bacteria, UA RARE (*)    All other components within normal limits  WET PREP, GENITAL  PREGNANCY, URINE  RAPID HIV SCREEN (HIV 1/2 AB+AG)  GC/CHLAMYDIA PROBE AMP ([place]) NOT AT [facility]    EKG None  Radiology No results found.  Procedures Procedures   Medications Ordered in ED Medications - No data to display  ED Course/ Medical Decision Making/ A&P                               Medical Decision Making Amount and/or Complexity of Data Reviewed Labs: ordered.   This patient presents to the ED for concern of STI.  Differential diagnosis includes gonorrhea, chlamydia, HIV, genital herpes   Lab Tests:  I Ordered, and personally interpreted labs.  The pertinent results include: UA with leukocytes and rare bacteria present, urine pregnancy negative   Problem List / ED Course:  Patient presents to the emergency department concerns of STI.  Requesting STD testing.  States that  partner is currently sexually active with multiple sexual partners.  Tested for gonorrhea chlamydia several months ago and was negative at that time.  Endorsing new and increased vaginal discharge with odor starting this morning.  Concerned about possible genital herpes as she has had some rash develop on her pubic area and labia.  UA, pregnancy, wet prep, gonorrhea chlamydia and HIV screening ordered for evaluation. Patient's workup is reassuring however UA shows possible signs of infection with some leukocytes and bacteria seen.  Rapid HIV is negative, wet prep negative, urine pregnancy negative as well.  Gonorrhea chlamydia pending. Advised patient that given findings of vaginal discharge and irritation, high concern for possible STI infection.  Patient is apprehensive about getting injection of Rocephin to treat this out of concern for STI infection.  Will do some prefer to opt for oral medications if possible.  Prescription for cefixime and doxycycline sent to patient's pharmacy.  Discussed strict return precautions with patient.  Patient discharged home in stable condition.   Final Clinical Impression(s) / ED Diagnoses Final diagnoses:  Possible exposure to STI  Vaginal discharge    Rx / DC Orders ED Discharge Orders          Ordered    cefixime (SUPRAX) 400 MG CAPS capsule  Daily        01/27/23 1558    doxycycline (VIBRAMYCIN) 100 MG capsule  2 times daily        01/27/23 1558              Smitty Knudsen, PA-C 01/27/23 1601    Alvira Monday, MD 01/29/23 1320

## 2023-01-27 NOTE — ED Triage Notes (Signed)
Pt requesting STD testing. Pt states her partner has other multiple partners. Pt reports what she thinks are razor bumps and having vaginal discharge.

## 2023-01-28 ENCOUNTER — Ambulatory Visit (HOSPITAL_COMMUNITY): Payer: Self-pay

## 2023-01-28 LAB — GC/CHLAMYDIA PROBE AMP (~~LOC~~) NOT AT ARMC
Chlamydia: NEGATIVE
Comment: NEGATIVE
Comment: NORMAL
Neisseria Gonorrhea: NEGATIVE

## 2023-02-09 ENCOUNTER — Encounter (HOSPITAL_BASED_OUTPATIENT_CLINIC_OR_DEPARTMENT_OTHER): Payer: Self-pay

## 2023-02-09 ENCOUNTER — Other Ambulatory Visit: Payer: Self-pay

## 2023-02-09 DIAGNOSIS — Z1152 Encounter for screening for COVID-19: Secondary | ICD-10-CM | POA: Diagnosis not present

## 2023-02-09 DIAGNOSIS — Z5321 Procedure and treatment not carried out due to patient leaving prior to being seen by health care provider: Secondary | ICD-10-CM | POA: Diagnosis not present

## 2023-02-09 DIAGNOSIS — J45909 Unspecified asthma, uncomplicated: Secondary | ICD-10-CM | POA: Insufficient documentation

## 2023-02-09 DIAGNOSIS — R059 Cough, unspecified: Secondary | ICD-10-CM | POA: Diagnosis present

## 2023-02-09 DIAGNOSIS — F17200 Nicotine dependence, unspecified, uncomplicated: Secondary | ICD-10-CM | POA: Diagnosis not present

## 2023-02-09 LAB — PREGNANCY, URINE: Preg Test, Ur: NEGATIVE

## 2023-02-09 LAB — SARS CORONAVIRUS 2 BY RT PCR: SARS Coronavirus 2 by RT PCR: NEGATIVE

## 2023-02-09 MED ORDER — ALBUTEROL SULFATE HFA 108 (90 BASE) MCG/ACT IN AERS: 2.0000 | INHALATION_SPRAY | RESPIRATORY_TRACT | Status: DC | PRN

## 2023-02-09 NOTE — ED Triage Notes (Signed)
Pt presents with cough after smoking. Pt states she has asthma and is out of her inhaler. Pt also concerned for a missed period and wants a pregnancy test.

## 2023-02-10 ENCOUNTER — Emergency Department (HOSPITAL_BASED_OUTPATIENT_CLINIC_OR_DEPARTMENT_OTHER)
Admission: EM | Admit: 2023-02-10 | Discharge: 2023-02-10 | Discharge status: Against Medical Advice | Payer: Non-veteran care | Attending: Emergency Medicine | Admitting: Emergency Medicine

## 2023-02-10 NOTE — ED Notes (Signed)
Pt did not answer after being called times 3 by Amy EMT

## 2023-05-03 ENCOUNTER — Ambulatory Visit (HOSPITAL_COMMUNITY)
Admission: EM | Admit: 2023-05-03 | Discharge: 2023-05-05 | Disposition: A | Discharge status: Home / Self Care | Payer: No Typology Code available for payment source | Attending: Psychiatry | Admitting: Psychiatry

## 2023-05-03 DIAGNOSIS — Z9151 Personal history of suicidal behavior: Secondary | ICD-10-CM | POA: Insufficient documentation

## 2023-05-03 DIAGNOSIS — F332 Major depressive disorder, recurrent severe without psychotic features: Secondary | ICD-10-CM | POA: Diagnosis present

## 2023-05-03 DIAGNOSIS — R45851 Suicidal ideations: Secondary | ICD-10-CM | POA: Insufficient documentation

## 2023-05-03 LAB — LIPID PANEL
Cholesterol: 172 mg/dL (ref 0–200)
HDL: 71 mg/dL (ref >40)
LDL Cholesterol: 89 mg/dL (ref 0–99)
Total CHOL/HDL Ratio: 2.4 {ratio}
Triglycerides: 60 mg/dL (ref <150)
VLDL: 12 mg/dL (ref 0–40)

## 2023-05-03 LAB — POC URINE PREG, ED: Preg Test, Ur: NEGATIVE

## 2023-05-03 LAB — CBC WITH DIFFERENTIAL/PLATELET
Abs Immature Granulocytes: 0.02 10*3/uL (ref 0.00–0.07)
Basophils Absolute: 0.1 10*3/uL (ref 0.0–0.1)
Basophils Relative: 1 %
Eosinophils Absolute: 0.5 10*3/uL (ref 0.0–0.5)
Eosinophils Relative: 6 %
HCT: 45.7 % (ref 36.0–46.0)
Hemoglobin: 15.4 g/dL — ABNORMAL HIGH (ref 12.0–15.0)
Immature Granulocytes: 0 %
Lymphocytes Relative: 28 %
Lymphs Abs: 2.3 10*3/uL (ref 0.7–4.0)
MCH: 30.3 pg (ref 26.0–34.0)
MCHC: 33.7 g/dL (ref 30.0–36.0)
MCV: 89.8 fL (ref 80.0–100.0)
Monocytes Absolute: 0.5 10*3/uL (ref 0.1–1.0)
Monocytes Relative: 7 %
Neutro Abs: 4.7 10*3/uL (ref 1.7–7.7)
Neutrophils Relative %: 58 %
Platelets: 304 10*3/uL (ref 150–400)
RBC: 5.09 MIL/uL (ref 3.87–5.11)
RDW: 12.7 % (ref 11.5–15.5)
WBC: 8.1 10*3/uL (ref 4.0–10.5)
nRBC: 0 % (ref 0.0–0.2)

## 2023-05-03 LAB — COMPREHENSIVE METABOLIC PANEL
ALT: 21 U/L (ref 0–44)
AST: 25 U/L (ref 15–41)
Albumin: 4.2 g/dL (ref 3.5–5.0)
Alkaline Phosphatase: 54 U/L (ref 38–126)
Anion gap: 11 (ref 5–15)
BUN: 11 mg/dL (ref 6–20)
CO2: 22 mmol/L (ref 22–32)
Calcium: 9.8 mg/dL (ref 8.9–10.3)
Chloride: 105 mmol/L (ref 98–111)
Creatinine, Ser: 0.72 mg/dL (ref 0.44–1.00)
GFR, Estimated: >60 mL/min (ref >60)
Glucose, Bld: 93 mg/dL (ref 70–99)
Potassium: 4.4 mmol/L (ref 3.5–5.1)
Sodium: 138 mmol/L (ref 135–145)
Total Bilirubin: 0.8 mg/dL (ref <1.2)
Total Protein: 7.1 g/dL (ref 6.5–8.1)

## 2023-05-03 LAB — POCT URINE DRUG SCREEN - MANUAL ENTRY (I-SCREEN)
POC Amphetamine UR: NOT DETECTED
POC Buprenorphine (BUP): NOT DETECTED
POC Cocaine UR: NOT DETECTED
POC Marijuana UR: POSITIVE — AB
POC Methadone UR: NOT DETECTED
POC Methamphetamine UR: NOT DETECTED
POC Morphine: NOT DETECTED
POC Oxazepam (BZO): NOT DETECTED
POC Oxycodone UR: NOT DETECTED
POC Secobarbital (BAR): NOT DETECTED

## 2023-05-03 LAB — POCT PREGNANCY, URINE: Preg Test, Ur: NEGATIVE

## 2023-05-03 LAB — HEMOGLOBIN A1C
Hgb A1c MFr Bld: 5.3 % (ref 4.8–5.6)
Mean Plasma Glucose: 105 mg/dL

## 2023-05-03 LAB — ETHANOL: Alcohol, Ethyl (B): <10 mg/dL (ref <10)

## 2023-05-03 LAB — POC SARS CORONAVIRUS 2 AG: SARSCOV2ONAVIRUS 2 AG: NEGATIVE

## 2023-05-03 LAB — TSH: TSH: 1.037 u[IU]/mL (ref 0.350–4.500)

## 2023-05-03 LAB — POC SARS CORONAVIRUS 2 AG (NURSE ORDER)

## 2023-05-03 MED ORDER — HYDROXYZINE HCL 25 MG PO TABS
25.0000 mg | ORAL_TABLET | Freq: Three times a day (TID) | ORAL | Status: DC | PRN
  Administered 2023-05-03 – 2023-05-04 (×2): 25 mg via ORAL
  Filled 2023-05-03 (×2): qty 1

## 2023-05-03 MED ORDER — ALBUTEROL SULFATE HFA 108 (90 BASE) MCG/ACT IN AERS: 2.0000 | INHALATION_SPRAY | Freq: Four times a day (QID) | RESPIRATORY_TRACT | Status: DC | PRN

## 2023-05-03 MED ORDER — QUETIAPINE FUMARATE 50 MG PO TABS
50.0000 mg | ORAL_TABLET | Freq: Every day | ORAL | Status: DC
  Administered 2023-05-03 – 2023-05-04 (×2): 50 mg via ORAL
  Filled 2023-05-03 (×2): qty 1

## 2023-05-03 MED ORDER — ACETAMINOPHEN 325 MG PO TABS: 650.0000 mg | ORAL_TABLET | Freq: Four times a day (QID) | ORAL | Status: DC | PRN

## 2023-05-03 MED ORDER — ALUM & MAG HYDROXIDE-SIMETH 200-200-20 MG/5ML PO SUSP: 30.0000 mL | ORAL | Status: DC | PRN

## 2023-05-03 MED ORDER — MAGNESIUM HYDROXIDE 400 MG/5ML PO SUSP: 30.0000 mL | Freq: Every day | ORAL | Status: DC | PRN

## 2023-05-03 NOTE — Progress Notes (Signed)
CSW spoke with Helmut Muster at the [facility], who confirmed there is currently bed availability. CSW agreed to send in a [facility] referral via fax. CSW has communicate with [facility] staff that a COVID test and transportation form needs to be complete prior to send the [facility] referral.    Damita Dunnings, MSW, LCSW-A  10:45 PM 05/03/2023

## 2023-05-03 NOTE — ED Notes (Signed)
Pt says she would like to be discharged tomorrow, if, [place] doesn't respond by tomorrow.

## 2023-05-03 NOTE — ED Notes (Signed)
Pt laying on her recliner calm and cooperative denies SI/HI/AVH no c/o pain or distress will continue to monitor for safety
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
## 2023-05-03 NOTE — ED Notes (Signed)
Meeting with provider.
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
## 2023-05-03 NOTE — ED Notes (Signed)
Pt sleeping@this time breathing even and unlabored will continue to monitor for safety 
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
## 2023-05-03 NOTE — ED Notes (Signed)
Patient admitted to Adults Observation for depression and SI. Patient endorses passive SI with no plan. She denies HI or AVH. Skin check conducted by this nurse and [facility], M[facility]. She denies physical pain or discomfort. Patient oriented to the unit and provided food and beverage. She denies any additional needs at this time. We will continue to monitor for safety.

## 2023-05-03 NOTE — Progress Notes (Signed)
CSW sent the assigned RN a copy of the [facility] Consent form to attach to the [facility] referral packet. CSW is still awaiting additional labs but as notified the assigned Care Team.    Paula Haley, MSW, LCSW-A  11:13 PM 05/03/2023

## 2023-05-04 ENCOUNTER — Inpatient Hospital Stay
Admission: RE | Admit: 2023-05-04 | Payer: No Typology Code available for payment source | Source: Intra-hospital | Admitting: Psychiatry

## 2023-05-04 NOTE — ED Notes (Signed)
Patient resting with eyes closed. Respirations even and unlabored. No acute distress noted. Environment secured. Will continue to monitor for safety.

## 2023-05-04 NOTE — Progress Notes (Signed)
CSW faxed BH referral to [facility] for continued BH review. CSW will continue to monitor the patient to secure recommended disposition.     Damita Dunnings, MSW, LCSW-A  10:38 AM 05/04/2023

## 2023-05-04 NOTE — ED Notes (Signed)
Pt sleeping@this time breathing even and unlabored will continue to monitor for safety 

## 2023-05-04 NOTE — ED Notes (Signed)
Pt resting quietly breathing even and unlabored

## 2023-05-04 NOTE — ED Notes (Signed)
Patient observed doing her hair. She has been seen interacting with staff and peers appropriately. She is aware of the plan for her to leave tomorrow. She has no additional needs or questions at this time. We will continue to monitor for safety.

## 2023-05-04 NOTE — ED Notes (Signed)
Patient observed resting quietly, eyes closed. Respirations equal and unlabored. Will continue to monitor for safety.  

## 2023-05-04 NOTE — ED Provider Notes (Signed)
Behavioral Health Progress Note  Date and Time: 05/04/2023 1:14 PM Name: Paula Haley MRN:  [id number]  Subjective:   Paula Haley is a 22 year old female patient with a past psychiatric history significant for MDD, ADHD, GAD, PTSD, and past suicide attempts who presented to the [facility] behavioral health urgent care voluntary accompanied by law enforcement for psychiatric evaluation.   Patient reports her mood has improved since sleeping well last night.  She reports she is no longer interested in inpatient psychiatric admission to the [place].  She admits to feeling overstimulated and experiencing intense anger when she is sleep deprived.  She reports that if her sleep is addressed her mood will remain regulated.  She has plans of following with the [place] on Monday.  She is agreeable to staying an additional night for observation, agrees to have an adequate safety plan presented for tomorrow.  Regarding her social history, the patient lives in [place] by herself in an apartment.  She reports her sister works often, and will sometimes stay with her.  She has no children.  She is single.  She reports currently studying psychology, and is currently in her third year.  In terms of social support, she vaguely identifies some "friends" that she can count on.  She denies any social support from family, reports she does not have a close relationship with them.  The [facility], admits she has not had frequent follow-up in the past, but wants to prioritize this in her life.  She also reports she has an upper appointment at [facility] counseling.  She denies any alcohol abuse, reports she has used cannabis in the past to help her sleep.  She is motivated to quit, she reports she has no plans of using cannabis again at discharge.  Today she denies any symptoms of depression or anxiety.  Denies suicidal ideations.  Denies homicidal ideations.  Denies auditory  and visual hallucinations.  There are no paranoid ideations or delusional thought processes identified.  Diagnosis:  Final diagnoses:  MDD (major depressive disorder), recurrent severe, without psychosis (HCC)  Suicidal ideation    Total Time spent with patient: 30 minutes  Additional Social History:    Pain Medications: See MAR Prescriptions: See MAR Over the Counter: See MAR History of alcohol / drug use?: No history of alcohol / drug abuse Longest period of sobriety (when/how long): N/A     Current Medications:  Current Facility-Administered Medications  Medication Dose Route Frequency Provider Last Rate Last Admin   acetaminophen (TYLENOL) tablet 650 mg  650 mg Oral Q6H PRN White, Patrice L, NP       albuterol (VENTOLIN HFA) 108 (90 Base) MCG/ACT inhaler 2 puff  2 puff Inhalation Q6H PRN White, Patrice L, NP       alum & mag hydroxide-simeth (MAALOX/MYLANTA) 200-200-20 MG/5ML suspension 30 mL  30 mL Oral Q4H PRN White, Patrice L, NP       hydrOXYzine (ATARAX) tablet 25 mg  25 mg Oral TID PRN White, Patrice L, NP   25 mg at 05/03/23 2131   magnesium hydroxide (MILK OF MAGNESIA) suspension 30 mL  30 mL Oral Daily PRN White, Patrice L, NP       QUEtiapine (SEROQUEL) tablet 50 mg  50 mg Oral QHS White, Patrice L, NP   50 mg at 05/03/23 2132   Current Outpatient Medications  Medication Sig Dispense Refill   albuterol (VENTOLIN HFA) 108 (90 Base) MCG/ACT inhaler  Inhale 2 puffs into the lungs every 6 (six) hours as needed for wheezing or shortness of breath.     cefixime (SUPRAX) 400 MG CAPS capsule Take 2 capsules (800 mg total) by mouth daily. (Patient not taking: Reported on 05/04/2023) 2 capsule 0   lactulose (CHRONULAC) 10 GM/15ML solution Take 15 mLs (10 g total) by mouth 2 (two) times daily. (Patient not taking: Reported on 05/04/2023) 236 mL 0    Labs  Lab Results:  Admission on 05/03/2023  Component Date Value Ref Range Status   WBC 05/03/2023 8.1  4.0 - 10.5 K/uL Final    RBC 05/03/2023 5.09  3.87 - 5.11 MIL/uL Final   Hemoglobin 05/03/2023 15.4 (H)  12.0 - 15.0 g/dL Final   HCT 23/55/7322 45.7  36.0 - 46.0 % Final   MCV 05/03/2023 89.8  80.0 - 100.0 fL Final   MCH 05/03/2023 30.3  26.0 - 34.0 pg Final   MCHC 05/03/2023 33.7  30.0 - 36.0 g/dL Final   RDW 02/54/2706 12.7  11.5 - 15.5 % Final   Platelets 05/03/2023 304  150 - 400 K/uL Final   nRBC 05/03/2023 0.0  0.0 - 0.2 % Final   Neutrophils Relative % 05/03/2023 58  % Final   Neutro Abs 05/03/2023 4.7  1.7 - 7.7 K/uL Final   Lymphocytes Relative 05/03/2023 28  % Final   Lymphs Abs 05/03/2023 2.3  0.7 - 4.0 K/uL Final   Monocytes Relative 05/03/2023 7  % Final   Monocytes Absolute 05/03/2023 0.5  0.1 - 1.0 K/uL Final   Eosinophils Relative 05/03/2023 6  % Final   Eosinophils Absolute 05/03/2023 0.5  0.0 - 0.5 K/uL Final   Basophils Relative 05/03/2023 1  % Final   Basophils Absolute 05/03/2023 0.1  0.0 - 0.1 K/uL Final   Immature Granulocytes 05/03/2023 0  % Final   Abs Immature Granulocytes 05/03/2023 0.02  0.00 - 0.07 K/uL Final   Performed at [facility], [address] [place], [place] [address]   Sodium 05/03/2023 138  135 - 145 mmol/L Final   Potassium 05/03/2023 4.4  3.5 - 5.1 mmol/L Final   Chloride 05/03/2023 105  98 - 111 mmol/L Final   CO2 05/03/2023 22  22 - 32 mmol/L Final   Glucose, Bld 05/03/2023 93  70 - 99 mg/dL Final   Glucose reference range applies only to samples taken after fasting for at least 8 hours.   BUN 05/03/2023 11  6 - 20 mg/dL Final   Creatinine, Ser 05/03/2023 0.72  0.44 - 1.00 mg/dL Final   Calcium 83/15/1761 9.8  8.9 - 10.3 mg/dL Final   Total Protein 60/73/7106 7.1  6.5 - 8.1 g/dL Final   Albumin 26/94/8546 4.2  3.5 - 5.0 g/dL Final   AST 27/07/5007 25  15 - 41 U/L Final   ALT 05/03/2023 21  0 - 44 U/L Final   Alkaline Phosphatase 05/03/2023 54  38 - 126 U/L Final   Total Bilirubin 05/03/2023 0.8  <1.2 mg/dL Final   GFR, Estimated 05/03/2023 >60  >60  mL/min Final   Comment: (NOTE) Calculated using the CKD-EPI Creatinine Equation (2021)    Anion gap 05/03/2023 11  5 - 15 Final   Performed at [facility], [address] [place], [place] [address]   Hgb A1c MFr Bld 05/03/2023 5.3  4.8 - 5.6 % Final   Comment: (NOTE)         Prediabetes: 5.7 - 6.4  Diabetes: >6.4         Glycemic control for adults with diabetes: <7.0    Mean Plasma Glucose 05/03/2023 105  mg/dL Final   Comment: (NOTE) Performed At: [facility] [address] [place], [place] [id number] Jolene Schimke MD QI:[id number]    Alcohol, Ethyl (B) 05/03/2023 <10  <10 mg/dL Final   Comment: (NOTE) Lowest detectable limit for serum alcohol is 10 mg/dL.  For medical purposes only. Performed at [facility], [address], [place], [place] [address]    Cholesterol 05/03/2023 172  0 - 200 mg/dL Final   Triglycerides 03/03/2535 60  <150 mg/dL Final   HDL 64/40/3474 71  >40 mg/dL Final   Total CHOL/HDL Ratio 05/03/2023 2.4  RATIO Final   VLDL 05/03/2023 12  0 - 40 mg/dL Final   LDL Cholesterol 05/03/2023 89  0 - 99 mg/dL Final   Comment:        Total Cholesterol/HDL:CHD Risk Coronary Heart Disease Risk Table                     Men   Women  1/2 Average Risk   3.4   3.3  Average Risk       5.0   4.4  2 X Average Risk   9.6   7.1  3 X Average Risk  23.4   11.0        Use the calculated Patient Ratio above and the CHD Risk Table to determine the patient's CHD Risk.        ATP III CLASSIFICATION (LDL):  <100     mg/dL   Optimal  259-563  mg/dL   Near or Above                    Optimal  130-159  mg/dL   Borderline  875-643  mg/dL   High  >329     mg/dL   Very High Performed at [facility], [address] [place], [place] [address]    TSH 05/03/2023 1.037  0.350 - 4.500 uIU/mL Final   Comment: Performed by a 3rd Generation assay with a functional sensitivity of <=0.01 uIU/mL. Performed at [facility], [address], [place], [place] [address]    Preg Test, Ur 05/03/2023 Negative  Negative Final   POC Amphetamine UR 05/03/2023 None Detected  NONE DETECTED (Cut Off Level 1000 ng/mL) Final   POC Secobarbital (BAR) 05/03/2023 None Detected  NONE DETECTED (Cut Off Level 300 ng/mL) Final   POC Buprenorphine (BUP) 05/03/2023 None Detected  NONE DETECTED (Cut Off Level 10 ng/mL) Final   POC Oxazepam (BZO) 05/03/2023 None Detected  NONE DETECTED (Cut Off Level 300 ng/mL) Final   POC Cocaine UR 05/03/2023 None Detected  NONE DETECTED (Cut Off Level 300 ng/mL) Final   POC Methamphetamine UR 05/03/2023 None Detected  NONE DETECTED (Cut Off Level 1000 ng/mL) Final   POC Morphine 05/03/2023 None Detected  NONE DETECTED (Cut Off Level 300 ng/mL) Final   POC Methadone UR 05/03/2023 None Detected  NONE DETECTED (Cut Off Level 300 ng/mL) Final   POC Oxycodone UR 05/03/2023 None Detected  NONE DETECTED (Cut Off Level 100 ng/mL) Final   POC Marijuana UR 05/03/2023 Positive (A)  NONE DETECTED (Cut Off Level 50 ng/mL) Final   Preg Test, Ur 05/03/2023 NEGATIVE  NEGATIVE Final   Comment:        THE SENSITIVITY OF THIS METHODOLOGY IS >24  mIU/mL    SARSCOV2ONAVIRUS 2 AG 05/03/2023 NEGATIVE  NEGATIVE Final   Comment: (NOTE) SARS-CoV-2 antigen NOT DETECTED.   Negative results are presumptive.  Negative results do not preclude SARS-CoV-2 infection and should not be used as the sole basis for treatment or other patient management decisions, including infection  control decisions, particularly in the presence of clinical signs and  symptoms consistent with COVID-19, or in those who have been in contact with the virus.  Negative results must be combined with clinical observations, patient history, and epidemiological information. The expected result is Negative.  Fact Sheet for Patients: https://www.jennings-kim.com/  Fact Sheet for Healthcare Providers: https://alexander-rogers.biz/  This test is not  yet approved or cleared by the [place] FDA and  has been authorized for detection and/or diagnosis of SARS-CoV-2 by FDA under an Emergency Use Authorization (EUA).  This EUA will remain in effect (meaning this test can be used) for the duration of  the COV                          ID-19 declaration under Section 564(b)(1) of the Act, 21 U.S.C. section 360bbb-3(b)(1), unless the authorization is terminated or revoked sooner.    Admission on 02/10/2023, Discharged on 02/10/2023  Component Date Value Ref Range Status   SARS Coronavirus 2 by RT PCR 02/09/2023 NEGATIVE  NEGATIVE Final   Comment: (NOTE) SARS-CoV-2 target nucleic acids are NOT DETECTED.  The SARS-CoV-2 RNA is generally detectable in upper and lower respiratory specimens during the acute phase of infection. The lowest concentration of SARS-CoV-2 viral copies this assay can detect is 250 copies / mL. A negative result does not preclude SARS-CoV-2 infection and should not be used as the sole basis for treatment or other patient management decisions.  A negative result may occur with improper specimen collection / handling, submission of specimen other than nasopharyngeal swab, presence of viral mutation(s) within the areas targeted by this assay, and inadequate number of viral copies (<250 copies / mL). A negative result must be combined with clinical observations, patient history, and epidemiological information.  Fact Sheet for Patients:   RoadLapTop.co.za  Fact Sheet for Healthcare Providers: http://kim-miller.com/  This test is not yet approved or                           cleared by the [place] FDA and has been authorized for detection and/or diagnosis of SARS-CoV-2 by FDA under an Emergency Use Authorization (EUA).  This EUA will remain in effect (meaning this test can be used) for the duration of the COVID-19 declaration under Section 564(b)(1) of the Act, 21  U.S.C. section 360bbb-3(b)(1), unless the authorization is terminated or revoked sooner.  Performed at [facility], [address] [place], [place] [address]    Preg Test, Ur 02/09/2023 NEGATIVE  NEGATIVE Final   Comment:        THE SENSITIVITY OF THIS METHODOLOGY IS >25 mIU/mL. Performed at [facility], [address], [place], [place] [address]   Admission on 01/27/2023, Discharged on 01/27/2023  Component Date Value Ref Range Status   Color, Urine 01/27/2023 YELLOW  YELLOW Final   APPearance 01/27/2023 CLEAR  CLEAR Final   Specific Gravity, Urine 01/27/2023 1.016  1.005 - 1.030 Final   pH 01/27/2023 6.5  5.0 - 8.0 Final   Glucose, UA 01/27/2023 NEGATIVE  NEGATIVE mg/dL Final   Hgb urine dipstick 01/27/2023 NEGATIVE  NEGATIVE Final   Bilirubin Urine 01/27/2023 NEGATIVE  NEGATIVE Final   Ketones, ur 01/27/2023 NEGATIVE  NEGATIVE mg/dL Final   Protein, ur 64/40/3474 NEGATIVE  NEGATIVE mg/dL Final   Nitrite 25/95/6387 NEGATIVE  NEGATIVE Final   Leukocytes,Ua 01/27/2023 SMALL (A)  NEGATIVE Final   RBC / HPF 01/27/2023 11-20  0 - 5 RBC/hpf Final   WBC, UA 01/27/2023 0-5  0 - 5 WBC/hpf Final   Bacteria, UA 01/27/2023 RARE (A)  NONE SEEN Final   Squamous Epithelial / HPF 01/27/2023 0-5  0 - 5 /HPF Final   Performed at [facility], [address] [place], [place] [address]   Preg Test, Ur 01/27/2023 NEGATIVE  NEGATIVE Final   Comment:        THE SENSITIVITY OF THIS METHODOLOGY IS >25 mIU/mL. Performed at [facility], [address] [place], [place] [address]    Yeast Wet Prep HPF POC 01/27/2023 NONE SEEN  NONE SEEN Final   Trich, Wet Prep 01/27/2023 NONE SEEN  NONE SEEN Final   Clue Cells Wet Prep HPF POC 01/27/2023 NONE SEEN  NONE SEEN Final   WBC, Wet Prep HPF POC 01/27/2023 <10  <10 Final   Sperm 01/27/2023 NONE SEEN   Final   Performed at [facility], [address], [place], [place] [address]   Neisseria Gonorrhea 01/27/2023 Negative   Final   Chlamydia 01/27/2023 Negative   Final   Comment 01/27/2023 Normal Reference Ranger Chlamydia - Negative   Final   Comment 01/27/2023 Normal Reference Range Neisseria Gonorrhea - Negative   Final   HIV-1 P24 Antigen - HIV24 01/27/2023 NON REACTIVE  NON REACTIVE Final   Comment: (NOTE) Detection of p24 may be inhibited by biotin in the sample, causing false negative results in acute infection.    HIV 1/2 Antibodies 01/27/2023 NON REACTIVE  NON REACTIVE Final   Interpretation (HIV Ag Ab) 01/27/2023 A non reactive test result means that HIV 1 or HIV 2 antibodies and HIV 1 p24 antigen were not detected in the specimen.   Final   Performed at [facility], [address], [place], [place] [address]    Blood Alcohol level:  Lab Results  Component Value Date   [facility] <10 05/03/2023   ETH <10 01/31/2019    Metabolic Disorder Labs: Lab Results  Component Value Date   HGBA1C 5.3 05/03/2023   MPG 105 05/03/2023   No results found for: "PROLACTIN" Lab Results  Component Value Date   CHOL 172 05/03/2023   TRIG 60 05/03/2023   HDL 71 05/03/2023   CHOLHDL 2.4 05/03/2023   VLDL 12 05/03/2023   LDLCALC 89 05/03/2023    Therapeutic Lab Levels: No results found for: "LITHIUM" No results found for: "VALPROATE" No results found for: "CBMZ"  Physical Findings   Flowsheet Row ED from 05/03/2023 in [facility] ED from 02/10/2023 in [facility] Emergency Department at [facility] ED from 01/27/2023 in [facility] Emergency Department at [facility]  C-SSRS RISK CATEGORY No Risk No Risk No Risk        Musculoskeletal  Strength & Muscle Tone: within normal limits Gait & Station: normal Patient leans: N/A  Psychiatric Specialty Exam  Presentation  General Appearance:  Casual  Eye Contact: Fair  Speech: Clear and Coherent  Speech  Volume: Decreased  Handedness: Right   Mood and Affect  Mood: Depressed  Affect: Congruent   Thought Process  Thought Processes: Coherent  Descriptions  of Associations:Intact  Orientation:Full (Time, Place and Person)  Thought Content:Logical  Diagnosis of Schizophrenia or Schizoaffective disorder in past: No    Hallucinations:Hallucinations: None  Ideas of Reference:No data recorded Suicidal Thoughts:Suicidal Thoughts: Yes, Passive  Homicidal Thoughts:Homicidal Thoughts: No   Sensorium  Memory: Immediate Fair; Recent Fair; Remote Fair  Judgment: Intact  Insight: Present   Executive Functions  Concentration: Fair  Attention Span: Fair  Recall: [employer] of Knowledge: Fair  Language: Fair   Psychomotor Activity  Psychomotor Activity: Psychomotor Activity: Normal   Assets  Assets: Communication Skills; Desire for Improvement; Housing; Leisure Time; Physical Health; Vocational/Educational   Sleep  Sleep: Sleep: Poor Number of Hours of Sleep: 3   Nutritional Assessment (For OBS and FBC admissions only) Has the patient had a weight loss or gain of 10 pounds or more in the last 3 months?: No Has the patient had a decrease in food intake/or appetite?: Yes Does the patient have dental problems?: No Does the patient have eating habits or behaviors that may be indicators of an eating disorder including binging or inducing vomiting?: No Has the patient recently lost weight without trying?: 0 Has the patient been eating poorly because of a decreased appetite?: 1 Malnutrition Screening Tool Score: 1    Physical Exam  Physical Exam Vitals and nursing note reviewed.  Constitutional:      General: She is not in acute distress.    Appearance: She is not ill-appearing.  HENT:     Head: Normocephalic and atraumatic.  Pulmonary:     Effort: Pulmonary effort is normal. No respiratory distress.  Skin:    General: Skin is warm and dry.     Review of Systems  All other systems reviewed and are negative.  Blood pressure 109/77, pulse 84, temperature 98.4 F (36.9 C), temperature source Oral, resp. rate 18, SpO2 100%. There is no height or weight on file to calculate BMI.  Treatment Plan Summary: Daily contact with patient to assess and evaluate symptoms and progress in treatment and Medication management Patient will be reevaluated tomorrow morning for discharge.  She currently does not meet criteria for IVC petition.  She has remained cooperative, compliant with her current scheduled medications.  She is denying any suicidal ideations in the past 24 hours.   Continue scheduled Seroquel   Labs were reviewed CBC showing some erythrocytosis hemoglobin 15.4, otherwise unremarkable.  CMP is unremarkable.  A1c is 5.3%.  Ethanol is negative for toxicity.  Lipid panel is WNL.  TSH is WNL.  UDS is positive for THC.  Urine pregnancy test is negative.  COVID test is negative.   Signed: Lorri Frederick, MD 05/04/2023 1:14 PM

## 2023-05-04 NOTE — ED Notes (Signed)
Patient A&Ox4. Patient sitting on bed requesting to look at TV. TV turned on per patient request. Patient currently denies SI/HI and AVH.  Patient denies any physical complaints when asked. No acute distress noted. Support and encouragement provided. Routine safety checks conducted according to facility protocol. Encouraged patient to notify staff if thoughts of harm toward self or others arise. Patient verbalize understanding and agreement. Will continue to monitor for safety.

## 2023-05-04 NOTE — ED Notes (Signed)
Patient resting quietly in bed with eyes closed. Respirations equal and unlabored, skin warm and dry, NAD. Routine safety checks conducted according to facility protocol. Will continue to monitor for safety.  

## 2023-05-04 NOTE — Progress Notes (Signed)
Pt is awake and alert. She has been using the phone this morning.  Denies SI, HI or AVH  pt states that " the [place] is not going to take me right away so I want to leave".  Currently awaiting a provider for eval.

## 2023-05-05 DIAGNOSIS — F332 Major depressive disorder, recurrent severe without psychotic features: Secondary | ICD-10-CM

## 2023-05-05 MED ORDER — QUETIAPINE FUMARATE 50 MG PO TABS: 50.0000 mg | ORAL_TABLET | Freq: Every day | ORAL | 0 refills | Status: AC

## 2023-05-05 MED ORDER — HYDROXYZINE HCL 25 MG PO TABS: 25.0000 mg | ORAL_TABLET | Freq: Three times a day (TID) | ORAL | 0 refills | Status: DC | PRN

## 2023-05-05 MED ORDER — HYDROXYZINE HCL 25 MG PO TABS: 25.0000 mg | ORAL_TABLET | Freq: Three times a day (TID) | ORAL | 0 refills | Status: AC | PRN

## 2023-05-05 NOTE — ED Notes (Signed)
Patient resting quietly in bed with eyes closed. Respirations equal and unlabored, skin warm and dry, NAD. Routine safety checks conducted according to facility protocol. Will continue to monitor for safety.  

## 2023-05-05 NOTE — Discharge Instructions (Signed)
Follow up as noted above.

## 2023-05-05 NOTE — ED Notes (Signed)
Pt refused breakfast 

## 2023-05-05 NOTE — ED Notes (Signed)
Patient discharged in no acute stress. At the time of discharge pt denies SI/HI or AVH. Pt's belongings returned. Patient escorted to the front lobby for discharge home. States she will take an [place]. Safety maintained.

## 2023-05-05 NOTE — ED Provider Notes (Signed)
FBC/OBS ASAP Discharge Summary  Date and Time: 05/05/2023 9:55 AM  Name: Paula Haley  MRN:  [id number]   Discharge Diagnoses:  Final diagnoses:  MDD (major depressive disorder), recurrent severe, without psychosis (HCC)  Suicidal ideation    HPI: Tenli Kado is a 22 year old female patient with a past psychiatric history significant for MDD, ADHD, GAD, PTSD, and past suicide attempts who presented to the [facility] behavioral health urgent care voluntary accompanied by law enforcement for psychiatric evaluation.   Stay Summary: On admission, pt reported stressors as being by herself during the holidays, reported "having a bad day", was unable to get services at the [place] which is were she typically goes for her mental health services, which [facility], and [employer]  bringing her to the [facility]. On presentation to this facility, pt endorses HI with no target, reported insomnia, feeling overstimulated, anxious, helpless, hopeless, irritable & isolating self in the context of worsening THC abuse. Pt verbalized the need to start back on her mental health medications so as to treat and stabilize her symptoms, and was restarted on Seroquel which she states was helpful in the past.   On assessment today, pt reports that her symptoms have stabilized. During the patient's stay, patient had extensive initial psychiatric evaluation, and follow-up psychiatric evaluations every day. The patient denies having side effects to prescribed psychiatric medication. Improvement has been noted by the patient's report of decreasing symptoms, improved sleep and appetite, affect, medication tolerance, and behavior.  On day of discharge, the patient reports that their mood is stable. The patient denies having suicidal thoughts for more than 48 hours prior to discharge.  Patient denies having homicidal thoughts.  Patient denies having auditory hallucinations.  Patient denies any visual  hallucinations or other symptoms of psychosis. The patient is motivated to continue taking medication with a goal of continued improvement in mental health. She reports that her plan is to walk into the [place] for continuity of care services on 05/09/2023. She reports that she states that she has an appointment in January with [facility]. She denies safety concerns at this time, states that she plans to reside with her sister after discharge, and go to her family in [place] for the upcoming New Year celebrations. Pt presents today with a euthymic mood, affect is congruent. Her attention to personal hygiene and grooming is fair, eye contact is good, speech is clear & coherent. Thought contents are organized and logical. She is requesting a 7 day supply of her medications, and plans to go to the [place] on 01/02.  The patient is able to verbalize their individual safety plan to this provider. Attempts made to call pt's sister at [phone] after pt provided consent for call to be placed, but there was no answer. Pt is verbally contracting for safety outside of the hospital at this time, denies SI/HI, denies intent or plan to harm herself or any one else. The below was discussed with pt prior to discharge, and she verbalized understanding of all.  # It is recommended to the patient to continue psychiatric medications as prescribed, after discharge from the hospital.    # It is recommended to the patient to follow up with your outpatient psychiatric provider and PCP.  # It was discussed with the patient, the impact of alcohol, drugs, tobacco on her overall psychiatric and medical wellbeing, and total abstinence from substance use was recommended the patient.ed.  # Prescriptions sent directly to  preferred pharmacy at discharge. Patient agreeable to plan. Given opportunity to ask questions. Appears to feel comfortable with discharge.    # In the event of worsening symptoms, the patient is  instructed to call the crisis hotline (988), 911 and or go to the nearest ED/come back to this [facility] for appropriate evaluation and treatment of symptoms. To follow-up with primary care provider for other medical issues, concerns and or health care needs  # Patient was discharged with a plan to follow up as noted below.   Total Time spent with patient: 45 minutes  Past Psychiatric History: MDD, ADHD, GAD, PTSD, Past Medical History: Asthma Family History: information not provided Family Psychiatric History: Denies  Social History: Not provided Tobacco Cessation:  A prescription for an FDA-approved tobacco cessation medication was offered at discharge and the patient refused  Current Medications:  Current Facility-Administered Medications  Medication Dose Route Frequency Provider Last Rate Last Admin   acetaminophen (TYLENOL) tablet 650 mg  650 mg Oral Q6H PRN White, Patrice L, NP       albuterol (VENTOLIN HFA) 108 (90 Base) MCG/ACT inhaler 2 puff  2 puff Inhalation Q6H PRN White, Patrice L, NP       alum & mag hydroxide-simeth (MAALOX/MYLANTA) 200-200-20 MG/5ML suspension 30 mL  30 mL Oral Q4H PRN White, Patrice L, NP       hydrOXYzine (ATARAX) tablet 25 mg  25 mg Oral TID PRN White, Patrice L, NP   25 mg at 05/04/23 2106   magnesium hydroxide (MILK OF MAGNESIA) suspension 30 mL  30 mL Oral Daily PRN White, Patrice L, NP       QUEtiapine (SEROQUEL) tablet 50 mg  50 mg Oral QHS White, Patrice L, NP   50 mg at 05/04/23 2105   Current Outpatient Medications  Medication Sig Dispense Refill   albuterol (VENTOLIN HFA) 108 (90 Base) MCG/ACT inhaler Inhale 2 puffs into the lungs every 6 (six) hours as needed for wheezing or shortness of breath.     cefixime (SUPRAX) 400 MG CAPS capsule Take 2 capsules (800 mg total) by mouth daily. (Patient not taking: Reported on 05/04/2023) 2 capsule 0   lactulose (CHRONULAC) 10 GM/15ML solution Take 15 mLs (10 g total) by mouth 2 (two) times daily. (Patient  not taking: Reported on 05/04/2023) 236 mL 0    PTA Medications:  Facility Ordered Medications  Medication   acetaminophen (TYLENOL) tablet 650 mg   alum & mag hydroxide-simeth (MAALOX/MYLANTA) 200-200-20 MG/5ML suspension 30 mL   magnesium hydroxide (MILK OF MAGNESIA) suspension 30 mL   hydrOXYzine (ATARAX) tablet 25 mg   QUEtiapine (SEROQUEL) tablet 50 mg   albuterol (VENTOLIN HFA) 108 (90 Base) MCG/ACT inhaler 2 puff   PTA Medications  Medication Sig   lactulose (CHRONULAC) 10 GM/15ML solution Take 15 mLs (10 g total) by mouth 2 (two) times daily. (Patient not taking: Reported on 05/04/2023)   cefixime (SUPRAX) 400 MG CAPS capsule Take 2 capsules (800 mg total) by mouth daily. (Patient not taking: Reported on 05/04/2023)   albuterol (VENTOLIN HFA) 108 (90 Base) MCG/ACT inhaler Inhale 2 puffs into the lungs every 6 (six) hours as needed for wheezing or shortness of breath.        No data to display          Flowsheet Row ED from 05/03/2023 in [facility] ED from 02/10/2023 in [facility] Emergency Department at [facility] ED from 01/27/2023 in [facility] Emergency Department at [facility]  [address]  C-SSRS RISK CATEGORY No Risk No Risk No Risk       Musculoskeletal  Strength & Muscle Tone: within normal limits Gait & Station: normal Patient leans: N/A  Psychiatric Specialty Exam  Presentation  General Appearance:  Appropriate for Environment; Fairly Groomed  Eye Contact: Good  Speech: Clear and Coherent  Speech Volume: Normal  Handedness: Right   Mood and Affect  Mood: Euthymic  Affect: Congruent   Thought Process  Thought Processes: Coherent  Descriptions of Associations:Intact  Orientation:Full (Time, Place and Person)  Thought Content:Logical  Diagnosis of Schizophrenia or Schizoaffective disorder in past: No    Hallucinations:Hallucinations: None  Ideas of Reference:None  Suicidal Thoughts:Suicidal  Thoughts: No  Homicidal Thoughts:Homicidal Thoughts: No   Sensorium  Memory: Immediate Good  Judgment: Good  Insight: Good   Executive Functions  Concentration: Good  Attention Span: Good  Recall: Good  Fund of Knowledge: Fair  Language: Good   Psychomotor Activity  Psychomotor Activity:Psychomotor Activity: Normal   Assets  Assets: Communication Skills; Resilience   Sleep  Sleep:Sleep: Good   No data recorded  Physical Exam  Physical Exam Vitals and nursing note reviewed.  Constitutional:      Appearance: Normal appearance.  Musculoskeletal:        General: Normal range of motion.     Cervical back: Normal range of motion.  Neurological:     General: No focal deficit present.     Mental Status: She is alert and oriented to person, place, and time.  Psychiatric:        Mood and Affect: Mood normal.        Behavior: Behavior normal.        Thought Content: Thought content normal.        Judgment: Judgment normal.    Review of Systems  Psychiatric/Behavioral:  Positive for depression (Denies SI/HI, denies plan or intent to harm self or any one else) and substance abuse (Educated on cessation). Negative for hallucinations, memory loss and suicidal ideas. The patient is nervous/anxious (Stable on current meds) and has insomnia (Stable on current meds).   All other systems reviewed and are negative.  Blood pressure (!) 138/99, pulse 70, temperature 98.7 F (37.1 C), temperature source Oral, resp. rate 20, SpO2 100%. There is no height or weight on file to calculate BMI.  Demographic Factors:  Adolescent or young adult, Low socioeconomic status, and Unemployed  Loss Factors: Financial problems/change in socioeconomic status  Historical Factors: Impulsivity  Risk Reduction Factors:   Living with another person, especially a relative  Continued Clinical Symptoms:  Unstable or Poor Therapeutic Relationship  Cognitive Features That  Contribute To Risk:  None    Suicide Risk:  Mild:  There are no identifiable suicide plans, no associated intent, mild dysphoria and related symptoms, good self-control (both objective and subjective assessment), few other risk factors, and identifiable protective factors, including available and accessible social support.   Plan Of Care/Follow-up recommendations:  [facility] -Follow up with [facility] for outpatient therapy services -Continue Seroquel 50 mg nighty for mood stabilization -Continue Hydroxyzine 25 mg TID PRN for anxiety -Continue Albuterol Inhaler PRN QID for SOB/Wheezing  Disposition: Discharge home and follow up as above.  [name], NP 05/05/2023, 9:55 AM

## 2023-06-04 ENCOUNTER — Other Ambulatory Visit: Payer: Self-pay

## 2023-06-04 ENCOUNTER — Emergency Department (HOSPITAL_BASED_OUTPATIENT_CLINIC_OR_DEPARTMENT_OTHER): Payer: No Typology Code available for payment source | Admitting: Radiology

## 2023-06-04 ENCOUNTER — Emergency Department (HOSPITAL_BASED_OUTPATIENT_CLINIC_OR_DEPARTMENT_OTHER)
Admission: EM | Admit: 2023-06-04 | Discharge: 2023-06-04 | Disposition: A | Discharge status: Home / Self Care | Payer: No Typology Code available for payment source | Attending: Emergency Medicine | Admitting: Emergency Medicine

## 2023-06-04 DIAGNOSIS — M25561 Pain in right knee: Secondary | ICD-10-CM | POA: Insufficient documentation

## 2023-06-04 MED ORDER — OXYCODONE-ACETAMINOPHEN 5-325 MG PO TABS
1.0000 | ORAL_TABLET | ORAL | Status: DC | PRN
  Administered 2023-06-04: 1 via ORAL
  Filled 2023-06-04: qty 1

## 2023-06-04 NOTE — Discharge Instructions (Signed)
Follow up with ortho in the office.   Take 4 over the counter ibuprofen tablets 3 times a day or 2 over-the-counter naproxen tablets twice a day for pain. Also take tylenol 1000mg (2 extra strength) four times a day.

## 2023-06-04 NOTE — ED Provider Notes (Signed)
Paula Haley Provider Note   CSN: [id number] Arrival date & time: 06/04/23  1053     History  Chief Complaint  Patient presents with   Knee Pain    Paula Haley is a 23 y.o. female.  55 yoF with a chief complaints of right knee pain.  The patient was running and she felt a pop in the knee since then has had pain and swelling and difficulty bearing weight.  She had something similar happen to her left leg at that time she had torn her ACL.  She had that repaired in the past.  She is not sure which doctor did it.   Knee Pain      Home Medications Prior to Admission medications   Medication Sig Start Date End Date Taking? Authorizing Provider  albuterol (VENTOLIN HFA) 108 (90 Base) MCG/ACT inhaler Inhale 2 puffs into the lungs every 6 (six) hours as needed for wheezing or shortness of breath. 12/20/22   [provider]  hydrOXYzine (ATARAX) 25 MG tablet Take 1 tablet (25 mg total) by mouth 3 (three) times daily as needed for anxiety. 05/05/23   Paula Blue, NP  QUEtiapine (SEROQUEL) 50 MG tablet Take 1 tablet (50 mg total) by mouth at bedtime. 05/05/23   Paula Blue, NP      Allergies    Naproxen and Penicillins    Review of Systems   Review of Systems  Physical Exam Updated Vital Signs BP (!) 128/91 (BP Location: Right Arm)   Pulse 82   Temp 98.5 F (36.9 C)   Resp 18   SpO2 100%  Physical Exam Vitals and nursing note reviewed.  Constitutional:      General: She is not in acute distress.    Appearance: She is well-developed. She is not diaphoretic.  HENT:     Head: Normocephalic and atraumatic.  Eyes:     Pupils: Pupils are equal, round, and reactive to light.  Cardiovascular:     Rate and Rhythm: Normal rate and regular rhythm.     Heart sounds: No murmur heard.    No friction rub. No gallop.  Pulmonary:     Effort: Pulmonary effort is normal.     Breath sounds: No wheezing or rales.  Abdominal:      General: There is no distension.     Palpations: Abdomen is soft.     Tenderness: There is no abdominal tenderness.  Musculoskeletal:        General: No tenderness.     Cervical back: Normal range of motion and neck supple.     Comments: Pain with range of motion of the right knee limits exam.  Pulse motor and sensation intact distally.  No significant edema.  Skin:    General: Skin is warm and dry.  Neurological:     Mental Status: She is alert and oriented to person, place, and time.  Psychiatric:        Behavior: Behavior normal.     ED Results / Procedures / Treatments   Labs (all labs ordered are listed, but only abnormal results are displayed) Labs Reviewed - No data to display  EKG None  Radiology DG Knee Complete 4 Views Right Result Date: 06/04/2023 CLINICAL DATA:  Right knee pain, felt pop.  Unable to bear weight. EXAM: RIGHT KNEE - COMPLETE 4+ VIEW COMPARISON:  None Available. FINDINGS: No evidence of fracture, dislocation, or joint effusion. No evidence of arthropathy or other focal bone abnormality.  Soft tissues are unremarkable. IMPRESSION: Negative. Electronically Signed   By: Paula Haley M.D.   On: 06/04/2023 12:09    Procedures Procedures    Medications Ordered in ED Medications  oxyCODONE-acetaminophen (PERCOCET/ROXICET) 5-325 MG per tablet 1 tablet (1 tablet Oral Given 06/04/23 1140)    ED Course/ Medical Decision Making/ A&P                                 Medical Decision Making Amount and/or Complexity of Data Reviewed Radiology: ordered.  Risk Prescription drug management.   23 yo F with a chief complaints of right knee pain.  Patient was running and she felt a pop.  Now is unable to bear weight for her.  Difficult to assess ligaments or meniscus due to discomfort.  No significant edema.  Plain film independently interpreted by me without fracture.  Will place in a knee immobilizer crutches orthopedic follow-up.  2:54 PM:  I have  discussed the diagnosis/risks/treatment options with the patient.  Evaluation and diagnostic testing in the emergency department does not suggest an emergent condition requiring admission or immediate intervention beyond what has been performed at this time.  They will follow up with PCP. We also discussed returning to the ED immediately if new or worsening sx occur. We discussed the sx which are most concerning (e.g., sudden worsening pain, fever, inability to tolerate by mouth) that necessitate immediate return. Medications administered to the patient during their visit and any new prescriptions provided to the patient are listed below.  Medications given during this visit Medications  oxyCODONE-acetaminophen (PERCOCET/ROXICET) 5-325 MG per tablet 1 tablet (1 tablet Oral Given 06/04/23 1140)     The patient appears reasonably screen and/or stabilized for discharge and I doubt any other medical condition or other [facility] requiring further screening, evaluation, or treatment in the ED at this time prior to discharge.          Final Clinical Impression(s) / ED Diagnoses Final diagnoses:  Acute pain of right knee    Rx / DC Orders ED Discharge Orders     None         Paula Plan, DO 06/04/23 1454

## 2023-06-04 NOTE — ED Triage Notes (Signed)
Pt caox4 c/o R knee pain stating approx 30 min ago she was running and heard knee pop and pain since, unable to bear weight on R leg d/t to pain.

## 2023-10-11 ENCOUNTER — Encounter (HOSPITAL_COMMUNITY): Payer: Self-pay

## 2023-10-11 ENCOUNTER — Ambulatory Visit (HOSPITAL_COMMUNITY)
Admission: RE | Admit: 2023-10-11 | Discharge: 2023-10-11 | Disposition: A | Discharge status: Home / Self Care | Source: Ambulatory Visit | Attending: Internal Medicine | Admitting: Internal Medicine

## 2023-10-11 VITALS — BP 112/78 | HR 89 | Temp 97.9°F | Resp 16

## 2023-10-11 DIAGNOSIS — N898 Other specified noninflammatory disorders of vagina: Secondary | ICD-10-CM | POA: Diagnosis present

## 2023-10-11 DIAGNOSIS — Z8616 Personal history of COVID-19: Secondary | ICD-10-CM | POA: Insufficient documentation

## 2023-10-11 DIAGNOSIS — N760 Acute vaginitis: Secondary | ICD-10-CM

## 2023-10-11 MED ORDER — METRONIDAZOLE 500 MG PO TABS: 500.0000 mg | ORAL_TABLET | Freq: Two times a day (BID) | ORAL | 0 refills | Status: AC

## 2023-10-11 NOTE — ED Provider Notes (Signed)
 [facility]    CSN: [id number] Arrival date & time: 10/11/23  0818      History   Chief Complaint Chief Complaint  Patient presents with   Vaginal Discharge    HPI Paula Haley is a 23 y.o. female.   Patient presents to urgent care for evaluation of clear vaginal discharge, vaginal odor, and vaginal irritation/discomfort without overt itching that started 3 to 4 days ago.  No recent new sexual contacts or concern for STD.  She denies urinary symptoms, nausea, vomiting, diarrhea, abdominal pain, flank pain, fever, chills, and recent antibiotic/steroid use.  History of BV, states this feels similar.  She cannot remember the last time she had BV (it has been several months).  Denies chance of pregnancy, LMP 10/01/2023.  She has not attempted use of any over-the-counter medications to help with symptoms PTA.   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    Past Surgical History:  Procedure Laterality Date   ANTERIOR CRUCIATE LIGAMENT REPAIR Left 07/18/2020   Procedure: left knee ACL reconstruction,medial  meniscal repair, debridement, QUADRICEP AUTOGRAFT;  Surgeon: Jasmine Mesi, MD;  Location: [facility];  Service: Orthopedics;  Laterality: Left;   SHOULDER CLOSED REDUCTION Left 09/09/2020   Procedure: LEFT KNEE MANIPULATION UNDER ANESTHESIA;  Surgeon: Jasmine Mesi, MD;  Location: [facility] OR;  Service: Orthopedics;  Laterality: Left;   VENTRICULOPERITONEAL SHUNT  2020    OB History    No obstetric history on file.      Home Medications    Prior to Admission medications   Medication Sig Start Date End Date Taking? Authorizing Provider  metroNIDAZOLE  (FLAGYL ) 500 MG tablet Take 1 tablet (500 mg total) by mouth 2 (two) times daily. 10/11/23  Yes Starlene Eaton, FNP  albuterol  (VENTOLIN  HFA) 108 (90 Base) MCG/ACT inhaler Inhale 2 puffs into the lungs every 6 (six) hours as needed for wheezing or shortness of breath. 12/20/22   [provider]  hydrOXYzine  (ATARAX ) 25 MG tablet Take 1 tablet (25 mg total) by mouth 3 (three) times daily as needed for anxiety. Patient not taking: Reported on 10/11/2023 05/05/23   Robet Chiquito, NP  QUEtiapine  (SEROQUEL ) 50 MG tablet Take 1 tablet (50 mg total) by mouth at bedtime. Patient not taking: Reported on 10/11/2023 05/05/23   Robet Chiquito, NP    Family History Family History  Problem Relation Age of Onset   Healthy Mother    Healthy Father     Social History Social History   Tobacco Use   Smoking status: Former    Types: Cigarettes   Smokeless tobacco: Never   Tobacco comments:    Occasional cigarette smoker  Vaping Use   Vaping status: Never Used  Substance Use Topics   Alcohol use: Yes   Drug use: Yes    Frequency: 7.0 times per week    Types: Marijuana    Comment: daily  Allergies   Naproxen and Penicillins   Review of Systems Review of Systems  Genitourinary:  Positive for vaginal discharge.  Per HPI   Physical Exam Triage Vital Signs ED Triage Vitals  Encounter Vitals Group     BP 10/11/23 0834 112/78     Systolic BP Percentile --      Diastolic BP Percentile --      Pulse Rate 10/11/23 0834 89     Resp 10/11/23 0834 16     Temp 10/11/23 0834 97.9 F (36.6 C)     Temp Source 10/11/23 0834 Oral     SpO2 10/11/23 0834 94 %     Weight --      Height --      Head Circumference --      Peak Flow --      Pain Score 10/11/23 0835 0     Pain Loc --      Pain Education --       Exclude from Growth Chart --    No data found.  Updated Vital Signs BP 112/78 (BP Location: Left Arm)   Pulse 89   Temp 97.9 F (36.6 C) (Oral)   Resp 16   LMP 10/01/2023 (Approximate)   SpO2 94%   Visual Acuity Right Eye Distance:   Left Eye Distance:   Bilateral Distance:    Right Eye Near:   Left Eye Near:    Bilateral Near:     Physical Exam Vitals and nursing note reviewed.  Constitutional:      Appearance: She is not ill-appearing or toxic-appearing.  HENT:     Head: Normocephalic and atraumatic.     Right Ear: Hearing and external ear normal.     Left Ear: Hearing and external ear normal.     Nose: Nose normal.     Mouth/Throat:     Lips: Pink.  Eyes:     General: Lids are normal. Vision grossly intact. Gaze aligned appropriately.     Extraocular Movements: Extraocular movements intact.     Conjunctiva/sclera: Conjunctivae normal.  Pulmonary:     Effort: Pulmonary effort is normal.  Musculoskeletal:     Cervical back: Neck supple.  Skin:    General: Skin is warm and dry.     Capillary Refill: Capillary refill takes less than 2 seconds.     Findings: No rash.  Neurological:     General: No focal deficit present.     Mental Status: She is alert and oriented to person, place, and time. Mental status is at baseline.     Cranial Nerves: No dysarthria or facial asymmetry.  Psychiatric:        Mood and Affect: Mood normal.        Speech: Speech normal.        Behavior: Behavior normal.        Thought Content: Thought content normal.        Judgment: Judgment normal.      UC Treatments / Results  Labs (all labs ordered are listed, but only abnormal results are displayed) Labs Reviewed  CERVICOVAGINAL ANCILLARY ONLY    EKG   Radiology No results found.  Procedures Procedures (including critical care time)  Medications Ordered in UC Medications - No data to display  Initial Impression / Assessment and Plan / UC Course  I have reviewed the  triage vital signs and the nursing notes.  Pertinent labs & imaging results that were available during my care of the patient  were reviewed by me and considered in my medical decision making (see chart for details).   1.  Acute vaginitis BV and yeast labs pending, no concern for STI. Will go ahead and treat with Flagyl  for BV given symptoms and history of same.  Discussed cessation of alcohol use during and for 72 hours after completing course of Flagyl  due to risks of disulfiram reaction. Discussed tips for BV/yeast prevention in the future. Recommend follow-up with OB/GYN PRN.    Counseled patient on potential for adverse effects with medications prescribed/recommended today, strict ER and return-to-clinic precautions discussed, patient verbalized understanding.    Final Clinical Impressions(s) / UC Diagnoses   Final diagnoses:  Acute vaginitis     Discharge Instructions      Your evaluation suggests that you likely have bacterial vaginitis.  Your vaginal testing is pending and will come back in the next 2-3 days. You will receive a phone call from staff if you test positive for any other infections. We will go ahead and start treatment for BV. Take antibiotic as prescribed to treat this. If you were given flagyl  pills, do not drink alcohol during or for 48 hours after finishing antibiotic course.  Follow-up with OB/GYN or return to urgent care as needed.     ED Prescriptions     Medication Sig Dispense Auth. Provider   metroNIDAZOLE  (FLAGYL ) 500 MG tablet Take 1 tablet (500 mg total) by mouth 2 (two) times daily. 14 tablet Starlene Eaton, FNP      PDMP not reviewed this encounter.   Shella Devoid [place], [place] 10/11/23 [phone]

## 2023-10-11 NOTE — Discharge Instructions (Signed)
 Your evaluation suggests that you likely have bacterial vaginitis.  Your vaginal testing is pending and will come back in the next 2-3 days. You will receive a phone call from staff if you test positive for any other infections. We will go ahead and start treatment for BV. Take antibiotic as prescribed to treat this. If you were given flagyl pills, do not drink alcohol during or for 48 hours after finishing antibiotic course.  Follow-up with OB/GYN or return to urgent care as needed.

## 2023-10-11 NOTE — ED Triage Notes (Signed)
 Patient here today with c/o vaginal discharge X 3-4 days. Denies other symptoms.

## 2023-10-14 ENCOUNTER — Ambulatory Visit (HOSPITAL_COMMUNITY): Payer: Self-pay

## 2023-10-14 LAB — CERVICOVAGINAL ANCILLARY ONLY
Bacterial Vaginitis (gardnerella): POSITIVE — AB
Candida Glabrata: NEGATIVE
Candida Vaginitis: NEGATIVE
Comment: NEGATIVE
Comment: NEGATIVE
Comment: NEGATIVE

## 2023-11-25 NOTE — Therapy (Signed)
 OUTPATIENT PHYSICAL THERAPY LOWER EXTREMITY EVALUATION   Patient Name: Paula Haley MRN: [id number] DOB:Jun 10, 2000, 23 y.o., female Today's Date: 11/26/2023  END OF SESSION:  PT End of Session - 11/26/23 1201     Visit Number 1    Number of Visits 15    Date for PT Re-Evaluation 02/07/24    [facility]ization Type [facility]    Authorization Time Period [facility] 15 VISITS 6/24-10/22/2025    PT Start Time 1100    PT Stop Time 1130    PT Time Calculation (min) 30 min    Activity Tolerance Patient limited by pain;Patient tolerated treatment well    Behavior During Therapy [facility] for tasks assessed/performed          Past Medical History:  Diagnosis Date   Anxiety    Dizziness    Endometriosis    Headache    History of COVID-19 05/09/2020   Hydrocephalus [facility])    Pituitary tumor    Pseudotumor cerebri    Past Surgical History:  Procedure Laterality Date   ANTERIOR CRUCIATE LIGAMENT REPAIR Left 07/18/2020   Procedure: left knee ACL reconstruction,medial  meniscal repair, debridement, QUADRICEP AUTOGRAFT;  Surgeon: Addie Cordella Hamilton, MD;  Location: [facility];  Service: Orthopedics;  Laterality: Left;   SHOULDER CLOSED REDUCTION Left 09/09/2020   Procedure: LEFT KNEE MANIPULATION UNDER ANESTHESIA;  Surgeon: Addie Cordella Hamilton, MD;  Location: [facility] OR;  Service: Orthopedics;  Laterality: Left;   VENTRICULOPERITONEAL SHUNT  2020   Patient Active Problem List   Diagnosis Date Noted   Fibrosis of left knee joint    Left anterior cruciate ligament tear    Acute medial meniscal tear, left, sequela    SIRS (systemic inflammatory response syndrome) (HCC) 02/01/2019   Allergic reaction 02/01/2019   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 She was running and felt a pop with pain following. It seemed to get better.  ~2 months right knee buckled going down stairs. Pain since then.  [facility] pain with Patella Femoral instability with 15 visits.    PERTINENT HISTORY: left knee ACL reconstruction, medial meniscus repair 07/18/20 with MUA 09/09/20. 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 No acute fracture or malalignment. Joint spaces maintained. No effusion.   PAIN:  NPRS scale: at rest 6-7/10,  stand / walk  5-8/10,  at night 8/10 trying to go to sleep but does not wake her.  Pain location: right knee patella including medial & lateral Pain description: throbbing, sharp Aggravating factors: walking, standing, initially upon standing if sitting >30 min Relieving factors: nothing helps  PRECAUTIONS: None  WEIGHT BEARING RESTRICTIONS: No  FALLS:  Has patient fallen in last 6 months? Yes. Number of falls 7-10 all right knee buckles  LIVING ENVIRONMENT: Lives with: lives alone Lives in: apartment 3rd floor without elevator Stairs: Yes: External: 8, then 20 X 2 steps; bilateral but cannot reach both Has following equipment at home: None  OCCUPATION:   on disability thru [place]  PLOF: Independent  PATIENT GOALS:  to stand & walk without pain, stop falling  Next MD visit:  [facility]  OBJECTIVE:   PATIENT SURVEYS:  Patient-Specific Activity Scoring Scheme  0 represents "unable to perform." 10 represents "able to perform at prior level. 0 1 2 3 4 5 6 7 8 9  10 (Date and Score)  Activity Eval     1.  Walking in community no pain   3    2. Standing for 1 hour no pain 3     3. Gym exercises for legs 0   4.    5.    Score 2    Total score = sum of the activity scores/number of activities Minimum detectable change (90%CI) for average score = 2 points Minimum detectable change (90%CI) for single activity score = 3 points  COGNITION: Overall cognitive status: WFL    SENSATION: WFL  EDEMA:  No edema noted  POSTURE:  Stands with BLE knee hyperextension  PALPATION: Patient denied tenderness over muscle belly of quadriceps, hamstrings and gastroc. Patient reported mild tenderness with palpation over quadriceps tendon and patella tendon. PT noted crepitus with patellofemoral motion for last 30 degrees of extension during a long arc quad; patient actually extends the knee into hyperextension with a pop noted on the patella from neutral to the 6 degrees of hyperextension.  LOWER EXTREMITY ROM:   ROM Right eval Left eval  Hip flexion    Hip extension    Hip abduction    Hip adduction    Hip internal rotation    Hip external rotation    Knee flexion 110*   Knee extension Hyperextension 6*   Ankle dorsiflexion    Ankle plantarflexion    Ankle inversion    Ankle eversion     (Blank rows = not tested)  LOWER EXTREMITY MMT:  MMT Right eval Left eval  Hip flexion    Hip extension    Hip abduction    Hip adduction    Hip internal rotation    Hip external rotation    Knee flexion    Knee extension 31.5#, 28.7# RLE:LLE 68% 43.5#, 44.8#  Ankle dorsiflexion    Ankle plantarflexion    Ankle inversion    Ankle eversion     (Blank rows = not tested)  LOWER EXTREMITY SPECIAL TESTS:  Right Knee special tests: Patellafemoral apprehension test: positive , Lateral pull sign: positive , and Patellafemoral grind test: positive   FUNCTIONAL TESTS:  18 inch chair transfer: able to arise without UE assist but wt shift to left  GAIT: Distance walked: 100' Assistive device utilized: None Level of  assistance: SBA / verbal cues for deviations but instability noted.  Pt reports spontaneous buckling that has resulted in falls but none noted during evaluation.  Comments: Antalgic gait with decreased stance duration RLE, right knee hyperextension in stance.  TODAY'S TREATMENT                                                                          DATE: 11/26/2023 Therapeutic Exercise: HEP instruction/performance c cues for techniques, handout provided.  Trial set performed of each for comprehension and symptom assessment.  See below for exercise list  PATIENT EDUCATION:  Education details: HEP, POC Person educated: Patient Education method: Explanation, Demonstration, Verbal cues, and Handouts Education comprehension: verbalized understanding, returned demonstration, and verbal cues required  HOME EXERCISE PROGRAM: Access Code: [email or web address] URL: [email or web address] Date: 11/26/2023 Prepared by: Grayce Spatz  Exercises - Supine Quad Set  - 3 x daily - 7 x weekly - 2 sets - 10 reps - 5 seconds hold   ASSESSMENT:  CLINICAL IMPRESSION: Patient is a 23 y.o. who comes to clinic with complaints of right knee pain with mobility, strength and movement coordination deficits that impair their ability to perform usual daily and recreational functional activities without increase difficulty/symptoms at this time. She has patella femoral pain & dysfunction.   Patient to benefit from skilled PT services to address impairments and limitations to improve to previous level of function without restriction secondary to condition.   OBJECTIVE IMPAIRMENTS: Abnormal gait, decreased balance, decreased mobility, decreased strength, postural dysfunction, and pain.   ACTIVITY LIMITATIONS: carrying, lifting, standing, sleeping,  stairs, transfers, and locomotion level  PARTICIPATION LIMITATIONS: shopping and community activity  PERSONAL FACTORS: Past/current experiences and 1-2 comorbidities: see PMH are also affecting patient's functional outcome.   REHAB POTENTIAL: Good  CLINICAL DECISION MAKING: Stable/uncomplicated  EVALUATION COMPLEXITY: Low   GOALS: Goals reviewed with patient? Yes  SHORT TERM GOALS: (target date for Short term goals 12/26/2023 )   1.  Patient will demonstrate independent use of home exercise program to maintain progress from in clinic treatments.  Goal status: New  LONG TERM GOALS: (target dates for all long term goals  02/07/2024 )   1. Patient will demonstrate/report pain at worst less than or equal to 2/10 to facilitate minimal limitation in daily activity secondary to pain symptoms. Goal status: New   2. Patient will demonstrate independent use of home exercise program to facilitate ability to maintain/progress functional gains from skilled physical therapy services. Goal status: New   3. Patient will demonstrate Patient specific functional scale avg > or = 5 to indicate reduced disability due to condition.  Goal status: New   4.  Patient will demonstrate right knee MMT 5/5 throughout to faciltiate usual transfers, stairs, squatting at [facility] for daily life.  Goal status: New   5.  Patient reports no falls in the last 4 weeks. Goal status: New   6.  Patient ambulates greater than 500', negotiate ramps and curbs without assistive device independently. Goal status: New   7.  Patient able to negotiate stairs with single rail alternating pattern modified independent. Goal Status: New   PLAN:  PT FREQUENCY: 1-2 x/week  PT DURATION:  10 weeks  PLANNED INTERVENTIONS: Can include 02853- PT Re-evaluation, 97110-Therapeutic exercises, 97530- Therapeutic activity, [id number]- Neuromuscular re-education, 97535- Self Care, 97140- Manual therapy, [id number]- Gait training, [phone]- Orthotic  Fit/training, [phone]- Canalith repositioning, [id number]- Aquatic Therapy, H9716- Electrical stimulation (unattended), [id number] Physical performance testing, 97035- Ultrasound,  02987- Traction (mechanical), 02966- Ionotophoresis 4mg /ml Dexamethasone ,  79439 - Needle insertion w/o injection 1 or 2 muscles, 20561 - Needle insertion w/o injection 3 or more muscles.   Patient/Family education, Balance training, Stair training, Taping, Dry Needling, Joint mobilization, Joint manipulation, Spinal manipulation, Spinal mobilization, Scar mobilization, Vestibular training, Visual/preceptual remediation/compensation, DME instructions, Cryotherapy, and Moist heat.  All performed as medically necessary.  All included unless contraindicated  PLAN FOR NEXT SESSION: update HEP including functional tasks & balance.  Consider taping for Patella Femoral tracking.     Grayce Spatz, PT, DPT 11/26/2023, 2:46 PM

## 2023-11-26 ENCOUNTER — Ambulatory Visit (INDEPENDENT_AMBULATORY_CARE_PROVIDER_SITE_OTHER): Admitting: Physical Therapy

## 2023-11-26 ENCOUNTER — Encounter: Payer: Self-pay | Admitting: Physical Therapy

## 2023-11-26 DIAGNOSIS — R2689 Other abnormalities of gait and mobility: Secondary | ICD-10-CM | POA: Diagnosis not present

## 2023-11-26 DIAGNOSIS — M25561 Pain in right knee: Secondary | ICD-10-CM

## 2023-11-26 DIAGNOSIS — M6281 Muscle weakness (generalized): Secondary | ICD-10-CM | POA: Diagnosis not present

## 2023-11-26 DIAGNOSIS — G8929 Other chronic pain: Secondary | ICD-10-CM

## 2023-12-03 ENCOUNTER — Ambulatory Visit (INDEPENDENT_AMBULATORY_CARE_PROVIDER_SITE_OTHER): Admitting: Physical Therapy

## 2023-12-03 ENCOUNTER — Encounter: Payer: Self-pay | Admitting: Physical Therapy

## 2023-12-03 DIAGNOSIS — R2689 Other abnormalities of gait and mobility: Secondary | ICD-10-CM

## 2023-12-03 DIAGNOSIS — M6281 Muscle weakness (generalized): Secondary | ICD-10-CM | POA: Diagnosis not present

## 2023-12-03 DIAGNOSIS — M25561 Pain in right knee: Secondary | ICD-10-CM

## 2023-12-03 DIAGNOSIS — G8929 Other chronic pain: Secondary | ICD-10-CM | POA: Diagnosis not present

## 2023-12-03 NOTE — Therapy (Addendum)
 PHYSICAL THERAPY DISCHARGE SUMMARY  Visits from Start of Care: 2  Current functional level related to goals / functional outcomes: Unknown as patient did not return to PT   Remaining deficits: unknown   Education / Equipment: Patient was instructed in initial HEP which she appeared to understand.    Patient agrees to discharge. Patient goals were not met. Patient is being discharged due to not returning since the last visit.   Grayce Spatz, PT, DPT 04/22/2024, 9:08 AM    OUTPATIENT PHYSICAL THERAPY LOWER EXTREMITY TREATMENT   Patient Name: Paula Haley MRN: [id number] DOB:September 01, 2000, 23 y.o., female Today's Date: 12/03/2023  END OF SESSION:  PT End of Session - 12/03/23 1343     Visit Number 2    Number of Visits 15    Date for PT Re-Evaluation 02/07/24    [facility]ization Type [facility]    Authorization Time Period [place] 15 VISITS 6/24-10/22/2025    Authorization - Visit Number 2    Authorization - Number of Visits 15    PT Start Time 1343    PT Stop Time 1424    PT Time Calculation (min) 41 min    Activity Tolerance Patient limited by pain;Patient tolerated treatment well    Behavior During Therapy [facility] for tasks assessed/performed           Past Medical History:  Diagnosis Date   Anxiety    Dizziness    Endometriosis    Headache    History of COVID-19 05/09/2020   Hydrocephalus [facility])    Pituitary tumor    Pseudotumor cerebri    Past Surgical History:  Procedure Laterality Date   ANTERIOR CRUCIATE LIGAMENT REPAIR Left 07/18/2020   Procedure: left knee ACL reconstruction,medial  meniscal repair, debridement, QUADRICEP AUTOGRAFT;  Surgeon: Addie Cordella Hamilton, MD;  Location: [facility];  Service: Orthopedics;  Laterality: Left;   SHOULDER CLOSED REDUCTION Left 09/09/2020   Procedure: LEFT KNEE MANIPULATION UNDER ANESTHESIA;  Surgeon: Addie Cordella Hamilton, MD;  Location: [facility] OR;  Service: Orthopedics;  Laterality: Left;   VENTRICULOPERITONEAL SHUNT   2020   Patient Active Problem List   Diagnosis Date Noted   Fibrosis of left knee joint    Left anterior cruciate ligament tear    Acute medial meniscal tear, left, sequela    SIRS (systemic inflammatory response syndrome) (HCC) 02/01/2019   Allergic reaction 02/01/2019   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  My knee cap feels like it wants to come out.    PERTINENT HISTORY: left knee ACL reconstruction, medial meniscus repair 07/18/20 with MUA 09/09/20. 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 No acute fracture or malalignment. Joint spaces maintained. No effusion.   PAIN:  NPRS scale: at rest  7-8/10,  stand / walk  4-7/10,  at night 7-8/10 trying to go to sleep but does not wake her.  Pain location: right knee patella including medial & lateral Pain description: throbbing, sharp Aggravating factors: walking, standing, initially upon standing if sitting >  30 min Relieving factors: nothing helps  PRECAUTIONS: None  WEIGHT BEARING RESTRICTIONS: No  FALLS:  Has patient fallen in last 6 months? Yes. Number of falls 7-10 all right knee buckles  LIVING ENVIRONMENT: Lives with: lives alone Lives in: apartment 3rd floor without elevator Stairs: Yes: External: 8, then 20 X 2 steps; bilateral but cannot reach both Has following equipment at home: None  OCCUPATION:   on disability thru [place]  PLOF: Independent  PATIENT GOALS:  to  stand & walk without pain, stop falling  Next MD visit:  [facility]  OBJECTIVE:   PATIENT SURVEYS:  Patient-Specific Activity Scoring Scheme  0 represents unable to perform. 10 represents able to perform at prior level. 0 1 2 3 4 5 6 7 8 9  10 (Date and Score)  Activity Eval     1.  Walking in community no pain  3    2. Standing for 1 hour no pain 3     3. Gym exercises for legs 0   4.    5.    Score 2    Total score = sum of the activity scores/number of activities Minimum detectable change (90%CI) for average score = 2 points Minimum detectable change (90%CI) for single activity score = 3 points  COGNITION: Overall cognitive status: WFL    SENSATION: WFL  EDEMA:  No edema noted  POSTURE:  Stands with BLE knee hyperextension  PALPATION: Patient denied tenderness over muscle belly of quadriceps, hamstrings and gastroc. Patient reported mild tenderness with palpation over quadriceps tendon and patella tendon. PT noted crepitus with patellofemoral motion for last 30 degrees of extension during a long arc quad; patient actually extends the knee into hyperextension with a pop noted on the patella from neutral to the 6 degrees of hyperextension.  LOWER EXTREMITY ROM:   ROM Right eval Left eval  Hip flexion    Hip extension    Hip abduction    Hip adduction    Hip internal rotation    Hip external rotation    Knee flexion 110*   Knee extension Hyperextension 6*   Ankle dorsiflexion    Ankle plantarflexion    Ankle inversion    Ankle eversion     (Blank rows = not tested)  LOWER EXTREMITY MMT:  MMT Right eval Left eval  Hip flexion    Hip extension    Hip abduction    Hip adduction    Hip internal rotation    Hip external rotation    Knee flexion    Knee extension 31.5#, 28.7# RLE:LLE 68% 43.5#, 44.8#  Ankle dorsiflexion    Ankle plantarflexion    Ankle inversion    Ankle eversion     (Blank rows = not  tested)  LOWER EXTREMITY SPECIAL TESTS:  Right Knee special tests: Patellafemoral apprehension test: positive , Lateral pull sign: positive , and Patellafemoral grind test: positive   FUNCTIONAL TESTS:  18 inch chair transfer: able to arise without UE assist but wt shift to left  GAIT: Distance walked: 100' Assistive device utilized: None Level of assistance: SBA / verbal cues for deviations but instability noted.  Pt reports spontaneous buckling that has resulted in falls but none noted during evaluation.  Comments: Antalgic gait with decreased stance duration RLE, right knee hyperextension in stance.  TODAY'S TREATMENT                                                                          DATE: 12/03/2023 Therapeutic Exercise: Patella taped as noted during all exercises.  bike seat 7 level 4 for 8 min.  RLE Seated straight leg raise 2 sets 10 reps with focus on quad control without hyperextension. RLE seated LAQ 2 sets 10 reps with focus on quad control without hyperextension. BLE heel raises with 2 sets 10 reps with focus on knee control without hyperextension. RLE supine SLR 2 sets 10 reps with focus on quad control without hyperextension. PT updated HEP (see below) with HO, demo & verbal cues.  Pt verbalized understanding.    Neuromuscular Re-education Standing RLE TKE blue theraband with PT tactile cues for knee ext without hyperextension.   RLE supine ball rolls with knee control for flexion & extension without hyper extension.   Manual Therapy Quad & Patella tendon taped in bilateral C pattern with lateral pull across patella.      TREATMENT                                                                          DATE: 11/26/2023 Therapeutic Exercise: HEP instruction/performance c cues for techniques, handout  provided.  Trial set performed of each for comprehension and symptom assessment.  See below for exercise list  PATIENT EDUCATION:  Education details: HEP, POC Person educated: Patient Education method: Explanation, Demonstration, Verbal cues, and Handouts Education comprehension: verbalized understanding, returned demonstration, and verbal cues required  HOME EXERCISE PROGRAM: Access Code: [email or web address] URL: [email or web address] Date: 12/03/2023 Prepared by: Grayce Spatz  Exercises - Supine Quad Set  - 2 x daily - 7 x weekly - 2 sets - 10 reps - 5 seconds hold - Seated Active Straight-Leg Raise  - 2 x daily - 7 x weekly - 2 sets - 10 reps - 5 seconds hold - Seated Long Arc Quad  - 2 x daily - 7 x weekly - 2 sets - 10 reps - 5 seconds hold - Supine Active Straight Leg Raise  - 2 x daily - 7 x weekly - 2 sets - 10 reps - 5 seconds hold - Supine Single Leg Hip and Knee Flexion ROM with Swiss Ball  - 2 x daily - 7 x weekly - 2 sets - 10 reps - 5 seconds hold   ASSESSMENT:  CLINICAL IMPRESSION: Pt appeared to have better patella tracking with Kinesio-taping. She would benefit from Kinesio Tape for use at home.   PT focused exercises on quad strengthening with no hyperextension.   Pt appears to understand updated HEP.    OBJECTIVE IMPAIRMENTS: Abnormal gait, decreased balance, decreased mobility, decreased strength, postural dysfunction, and pain.   ACTIVITY LIMITATIONS: carrying, lifting, standing, sleeping, stairs, transfers, and locomotion level  PARTICIPATION LIMITATIONS: shopping and community activity  PERSONAL FACTORS: Past/current experiences and 1-2 comorbidities: see PMH are also affecting patient's  functional outcome.   REHAB POTENTIAL: Good  CLINICAL DECISION MAKING: Stable/uncomplicated  EVALUATION COMPLEXITY: Low   GOALS: Goals reviewed with patient? Yes  SHORT TERM GOALS: (target date for Short term goals 12/26/2023 )   1.  Patient will demonstrate  independent use of home exercise program to maintain progress from in clinic treatments. Goal status: Ongoing   12/03/2023  LONG TERM GOALS: (target dates for all long term goals  02/07/2024 )   1. Patient will demonstrate/report pain at worst less than or equal to 2/10 to facilitate minimal limitation in daily activity secondary to pain symptoms. Goal status: Ongoing   12/03/2023   2. Patient will demonstrate independent use of home exercise program to facilitate ability to maintain/progress functional gains from skilled physical therapy services. Goal status: Ongoing   12/03/2023   3. Patient will demonstrate Patient specific functional scale avg > or = 5 to indicate reduced disability due to condition.  Goal status: Ongoing   12/03/2023   4.  Patient will demonstrate right knee MMT 5/5 throughout to faciltiate usual transfers, stairs, squatting at [facility] for daily life.  Goal status: Ongoing   12/03/2023   5.  Patient reports no falls in the last 4 weeks. Goal status: Ongoing   12/03/2023   6.  Patient ambulates greater than 500', negotiate ramps and curbs without assistive device independently. Goal status: Ongoing   12/03/2023   7.  Patient able to negotiate stairs with single rail alternating pattern modified independent. Goal Status:  Ongoing   12/03/2023   PLAN:  PT FREQUENCY: 1-2 x/week  PT DURATION:  10 weeks  PLANNED INTERVENTIONS: Can include 02853- PT Re-evaluation, 97110-Therapeutic exercises, 97530- Therapeutic activity, 97112- Neuromuscular re-education, 97535- Self Care, 97140- Manual therapy, [phone]- Gait training, [phone]- Orthotic Fit/training, [phone]- Canalith repositioning, [id number]- Aquatic Therapy, [phone]- Electrical stimulation (unattended), [id number] Physical performance testing, [id number]- Ultrasound, [id number]- Traction (mechanical), [id number]- Ionotophoresis 4mg /ml Dexamethasone ,  79439 - Needle insertion w/o injection 1 or 2 muscles, 20561 - Needle insertion w/o injection 3 or more  muscles.   Patient/Family education, Balance training, Stair training, Taping, Dry Needling, Joint mobilization, Joint manipulation, Spinal manipulation, Spinal mobilization, Scar mobilization, Vestibular training, Visual/preceptual remediation/compensation, DME instructions, Cryotherapy, and Moist heat.  All performed as medically necessary.  All included unless contraindicated  PLAN FOR NEXT SESSION:   check updated HEP including functional tasks & balance. Ask if taping for Patella Femoral tracking helped.     Grayce Spatz, PT, DPT 12/03/2023, 2:41 PM

## 2023-12-10 ENCOUNTER — Encounter: Admitting: Physical Therapy

## 2023-12-12 ENCOUNTER — Encounter: Admitting: Rehabilitative and Restorative Service Providers"

## 2023-12-16 ENCOUNTER — Encounter: Admitting: Rehabilitative and Restorative Service Providers"

## 2023-12-16 NOTE — Therapy (Incomplete)
 OUTPATIENT PHYSICAL THERAPY TREATMENT   Patient Name: Paula Haley MRN: [id number] DOB:03/08/2001, 23 y.o., female Today's Date: 12/16/2023  END OF SESSION:     Past Medical History:  Diagnosis Date   Anxiety    Dizziness    Endometriosis    Headache    History of COVID-19 05/09/2020   Hydrocephalus [facility])    Pituitary tumor    Pseudotumor cerebri    Past Surgical History:  Procedure Laterality Date   ANTERIOR CRUCIATE LIGAMENT REPAIR Left 07/18/2020   Procedure: left knee ACL reconstruction,medial  meniscal repair, debridement, QUADRICEP AUTOGRAFT;  Surgeon: Addie Cordella Hamilton, MD;  Location: [facility];  Service: Orthopedics;  Laterality: Left;   SHOULDER CLOSED REDUCTION Left 09/09/2020   Procedure: LEFT KNEE MANIPULATION UNDER ANESTHESIA;  Surgeon: Addie Cordella Hamilton, MD;  Location: [facility] OR;  Service: Orthopedics;  Laterality: Left;   VENTRICULOPERITONEAL SHUNT  2020   Patient Active Problem List   Diagnosis Date Noted   Fibrosis of left knee joint    Left anterior cruciate ligament tear    Acute medial meniscal tear, left, sequela    SIRS (systemic inflammatory response syndrome) (HCC) 02/01/2019   Allergic reaction 02/01/2019   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  Rationale for Evaluation and Treatment: Rehabilitation  ONSET DATE: 10/30/2023  MD referral to PT  SUBJECTIVE:   SUBJECTIVE STATEMENT: She has been doing her exercises.  My knee cap feels like it wants to come out.    PERTINENT HISTORY: left knee ACL reconstruction, medial meniscus repair 07/18/20 with MUA 09/09/20. 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 No acute fracture or  malalignment. Joint spaces maintained. No effusion.   PAIN:  NPRS scale: at rest  7-8/10,  stand / walk  4-7/10,  at night 7-8/10 trying to go to sleep but does not wake her.  Pain location: right knee patella including medial & lateral Pain description: throbbing, sharp Aggravating factors: walking, standing, initially upon standing if sitting >30 min Relieving factors: nothing helps  PRECAUTIONS: None  WEIGHT BEARING RESTRICTIONS: No  FALLS:  Has patient fallen in last 6 months? Yes. Number of falls 7-10 all right knee buckles  LIVING ENVIRONMENT: Lives with: lives alone Lives in: apartment 3rd floor without elevator Stairs: Yes: External: 8, then 20 X 2 steps; bilateral but cannot reach both Has following equipment at home: None  OCCUPATION:   on disability thru [place]  PLOF: Independent  PATIENT GOALS:  to stand & walk without pain, stop falling  Next MD visit:  [facility]  OBJECTIVE:   PATIENT SURVEYS:  Patient-Specific Activity Scoring Scheme  0 represents "unable to perform." 10 represents "able to perform at prior level. 0 1 2 3 4 5 6 7 8 9  10 (Date and Score)  Activity Eval  11/26/2023    1.  Walking in community no pain  3    2. Standing for 1 hour no pain 3     3. Gym exercises for legs 0   4.    5.    Score 2    Total score = sum of the activity scores/number of activities Minimum detectable change (90%CI) for average score = 2 points  Minimum detectable change (90%CI) for single activity score = 3 points  COGNITION: 11/26/2023 Overall cognitive status: WFL    SENSATION: 11/26/2023 WFL  EDEMA:  11/26/2023 No edema noted  POSTURE:  11/26/2023 Stands with BLE knee hyperextension  PALPATION: 11/26/2023 Patient denied tenderness over muscle belly of quadriceps, hamstrings and gastroc. Patient reported mild tenderness with palpation over quadriceps tendon and patella tendon. PT noted crepitus with patellofemoral motion  for last 30 degrees of extension during a long arc quad; patient actually extends the knee into hyperextension with a pop noted on the patella from neutral to the 6 degrees of hyperextension.  LOWER EXTREMITY ROM:   ROM Right Eval 11/26/2023 Left Eval 11/26/2023  Hip flexion    Hip extension    Hip abduction    Hip adduction    Hip internal rotation    Hip external rotation    Knee flexion 110*   Knee extension Hyperextension 6*   Ankle dorsiflexion    Ankle plantarflexion    Ankle inversion    Ankle eversion     (Blank rows = not tested)  LOWER EXTREMITY MMT:  MMT Right Eval 11/26/2023 Left Eval 11/26/2023  Hip flexion    Hip extension    Hip abduction    Hip adduction    Hip internal rotation    Hip external rotation    Knee flexion    Knee extension 31.5#, 28.7# RLE:LLE 68% 43.5#, 44.8#  Ankle dorsiflexion    Ankle plantarflexion    Ankle inversion    Ankle eversion     (Blank rows = not tested)  LOWER EXTREMITY SPECIAL TESTS:  11/26/2023 Right Knee special tests: Patellafemoral apprehension test: positive , Lateral pull sign: positive , and Patellafemoral grind test: positive   FUNCTIONAL TESTS:  11/26/2023 18 inch chair transfer: able to arise without UE assist but wt shift to left  GAIT: 11/26/2023 Distance walked: 100' Assistive device utilized: None Level of assistance: SBA / verbal cues for deviations but instability noted.  Pt reports spontaneous buckling that has resulted in falls but none noted during evaluation.  Comments: Antalgic gait with decreased stance duration RLE, right knee hyperextension in stance.                                                                                                                                                                        TODAY'S TREATMENT  DATE: 12/16/2023 Therex:   Neuro Re-ed:      TODAY'S TREATMENT                                                                           DATE: 12/03/2023 Therapeutic Exercise: Patella taped as noted during all exercises.  bike seat 7 level 4 for 8 min.  RLE Seated straight leg raise 2 sets 10 reps with focus on quad control without hyperextension. RLE seated LAQ 2 sets 10 reps with focus on quad control without hyperextension. BLE heel raises with 2 sets 10 reps with focus on knee control without hyperextension. RLE supine SLR 2 sets 10 reps with focus on quad control without hyperextension. PT updated HEP (see below) with HO, demo & verbal cues.  Pt verbalized understanding.    Neuromuscular Re-education Standing RLE TKE blue theraband with PT tactile cues for knee ext without hyperextension.   RLE supine ball rolls with knee control for flexion & extension without hyper extension.   Manual Therapy Quad & Patella tendon taped in bilateral C pattern with lateral pull across patella.      TREATMENT                                                                          DATE: 11/26/2023 Therapeutic Exercise: HEP instruction/performance c cues for techniques, handout provided.  Trial set performed of each for comprehension and symptom assessment.  See below for exercise list  PATIENT EDUCATION:  Education details: HEP, POC Person educated: Patient Education method: Explanation, Demonstration, Verbal cues, and Handouts Education comprehension: verbalized understanding, returned demonstration, and verbal cues required  HOME EXERCISE PROGRAM: Access Code: [email or web address] URL: [email or web address] Date: 12/03/2023 Prepared by: Grayce Spatz  Exercises - Supine Quad Set  - 2 x daily - 7 x weekly - 2 sets - 10 reps - 5 seconds hold - Seated Active Straight-Leg Raise  - 2 x daily - 7 x weekly - 2 sets - 10 reps - 5 seconds hold - Seated Long Arc Quad  - 2 x daily - 7 x weekly - 2 sets - 10 reps - 5 seconds hold - Supine Active Straight Leg Raise  - 2 x daily  - 7 x weekly - 2 sets - 10 reps - 5 seconds hold - Supine Single Leg Hip and Knee Flexion ROM with Swiss Ball  - 2 x daily - 7 x weekly - 2 sets - 10 reps - 5 seconds hold   ASSESSMENT:  CLINICAL IMPRESSION: Pt appeared to have better patella tracking with Kinesio-taping. She would benefit from Kinesio Tape for use at home.   PT focused exercises on quad strengthening with no hyperextension.   Pt appears to understand updated HEP.    OBJECTIVE IMPAIRMENTS: Abnormal gait, decreased balance, decreased mobility, decreased strength, postural dysfunction, and pain.   ACTIVITY LIMITATIONS: carrying, lifting, standing, sleeping, stairs, transfers, and locomotion level  PARTICIPATION LIMITATIONS: shopping and community activity  PERSONAL FACTORS: Past/current experiences and 1-2 comorbidities: see PMH are also affecting patient's functional outcome.   REHAB POTENTIAL: Good  CLINICAL DECISION MAKING: Stable/uncomplicated  EVALUATION COMPLEXITY: Low   GOALS: Goals reviewed with patient? Yes  SHORT TERM GOALS: (target date for Short term goals 12/26/2023 )   1.  Patient will demonstrate independent use of home exercise program to maintain progress from in clinic treatments. Goal status: Ongoing   12/03/2023  LONG TERM GOALS: (target dates for all long term goals  02/07/2024 )   1. Patient will demonstrate/report pain at worst less than or equal to 2/10 to facilitate minimal limitation in daily activity secondary to pain symptoms. Goal status: Ongoing   12/03/2023   2. Patient will demonstrate independent use of home exercise program to facilitate ability to maintain/progress functional gains from skilled physical therapy services. Goal status: Ongoing   12/03/2023   3. Patient will demonstrate Patient specific functional scale avg > or = 5 to indicate reduced disability due to condition.  Goal status: Ongoing   12/03/2023   4.  Patient will demonstrate right knee MMT 5/5 throughout to  faciltiate usual transfers, stairs, squatting at [facility] for daily life.  Goal status: Ongoing   12/03/2023   5.  Patient reports no falls in the last 4 weeks. Goal status: Ongoing   12/03/2023   6.  Patient ambulates greater than 500', negotiate ramps and curbs without assistive device independently. Goal status: Ongoing   12/03/2023   7.  Patient able to negotiate stairs with single rail alternating pattern modified independent. Goal Status:  Ongoing   12/03/2023   PLAN:  PT FREQUENCY: 1-2 x/week  PT DURATION:  10 weeks  PLANNED INTERVENTIONS: Can include 02853- PT Re-evaluation, 97110-Therapeutic exercises, 97530- Therapeutic activity, 97112- Neuromuscular re-education, 97535- Self Care, 97140- Manual therapy, [phone]- Gait training, [phone]- Orthotic Fit/training, [phone]- Canalith repositioning, [id number]- Aquatic Therapy, [phone]- Electrical stimulation (unattended), [id number] Physical performance testing, [id number]- Ultrasound, [id number]- Traction (mechanical), [id number]- Ionotophoresis 4mg /ml Dexamethasone ,  [id number] - Needle insertion w/o injection 1 or 2 muscles, 20561 - Needle insertion w/o injection 3 or more muscles.   Patient/Family education, Balance training, Stair training, Taping, Dry Needling, Joint mobilization, Joint manipulation, Spinal manipulation, Spinal mobilization, Scar mobilization, Vestibular training, Visual/preceptual remediation/compensation, DME instructions, Cryotherapy, and Moist heat.  All performed as medically necessary.  All included unless contraindicated  PLAN FOR NEXT SESSION:   check updated HEP including functional tasks & balance. Ask if taping for Patella Femoral tracking helped.     Ozell Silvan, PT, DPT, OCS, ATC 12/16/23  8:47 AM

## 2023-12-19 ENCOUNTER — Encounter: Admitting: Rehabilitative and Restorative Service Providers"

## 2024-01-06 ENCOUNTER — Emergency Department (HOSPITAL_BASED_OUTPATIENT_CLINIC_OR_DEPARTMENT_OTHER)
Admission: EM | Admit: 2024-01-06 | Discharge: 2024-01-06 | Disposition: A | Discharge status: Home / Self Care | Attending: Emergency Medicine | Admitting: Emergency Medicine

## 2024-01-06 ENCOUNTER — Other Ambulatory Visit: Payer: Self-pay

## 2024-01-06 ENCOUNTER — Encounter (HOSPITAL_BASED_OUTPATIENT_CLINIC_OR_DEPARTMENT_OTHER): Payer: Self-pay

## 2024-01-06 ENCOUNTER — Emergency Department (HOSPITAL_BASED_OUTPATIENT_CLINIC_OR_DEPARTMENT_OTHER)

## 2024-01-06 DIAGNOSIS — X501XXA Overexertion from prolonged static or awkward postures, initial encounter: Secondary | ICD-10-CM | POA: Insufficient documentation

## 2024-01-06 DIAGNOSIS — S93492A Sprain of other ligament of left ankle, initial encounter: Secondary | ICD-10-CM | POA: Insufficient documentation

## 2024-01-06 NOTE — ED Provider Notes (Signed)
 [place] EMERGENCY DEPARTMENT AT [facility] Provider Note   CSN: [id number] Arrival date & time: 01/06/24  2147     Patient presents with: Ankle Injury (Left)   Paula Haley is a 23 y.o. female.   The history is provided by the patient. No language interpreter was used.  Ankle Injury     23 year old female presenting for evaluation of ankle injury.  Patient states she was walking tonight and stepped in a hole and twisted her left ankle.  She report acute onset of sharp pain about the ankle worse with ambulation.  She is able to bear weight.  She has noted some swelling to the affected area.  She denies any knee pain or hip pain.  No specific treatment tried at home.  Prior to Admission medications   Medication Sig Start Date End Date Taking? Authorizing Provider  albuterol  (VENTOLIN  HFA) 108 (90 Base) MCG/ACT inhaler Inhale 2 puffs into the lungs every 6 (six) hours as needed for wheezing or shortness of breath. 12/20/22   [provider]  hydrOXYzine  (ATARAX ) 25 MG tablet Take 1 tablet (25 mg total) by mouth 3 (three) times daily as needed for anxiety. Patient not taking: Reported on 10/11/2023 05/05/23   Tex Drilling, NP  metroNIDAZOLE  (FLAGYL ) 500 MG tablet Take 1 tablet (500 mg total) by mouth 2 (two) times daily. 10/11/23   Enedelia Dorna HERO, FNP  QUEtiapine  (SEROQUEL ) 50 MG tablet Take 1 tablet (50 mg total) by mouth at bedtime. Patient not taking: Reported on 10/11/2023 05/05/23   Tex Drilling, NP    Allergies: Naproxen and Penicillins    Review of Systems  Constitutional:  Negative for fever.  Musculoskeletal:  Positive for arthralgias.  Skin:  Negative for wound.    Updated Vital Signs BP 135/75   Pulse 91   Temp 97.9 F (36.6 C)   Resp 16   LMP  (LMP Unknown)   SpO2 100%   Physical Exam Vitals and nursing note reviewed.  Constitutional:      General: She is not in acute distress.    Appearance: She is well-developed.  HENT:      Head: Atraumatic.  Eyes:     Conjunctiva/sclera: Conjunctivae normal.  Pulmonary:     Effort: Pulmonary effort is normal.  Musculoskeletal:        General: Signs of injury (Left ankle: Tenderness noted to the lateral ankle and proximal dorsum of foot with some swelling noted.  Normal range of motion about the ankle with no deformity DP pulse palpable.) present.     Cervical back: Neck supple.  Skin:    Findings: No rash.  Neurological:     Mental Status: She is alert.  Psychiatric:        Mood and Affect: Mood normal.     (all labs ordered are listed, but only abnormal results are displayed) Labs Reviewed - No data to display  EKG: None  Radiology: DG Foot Complete Left Result Date: 01/06/2024 CLINICAL DATA:  Left foot pain, fall EXAM: LEFT FOOT - COMPLETE 3+ VIEW COMPARISON:  None Available. FINDINGS: There is no evidence of fracture or dislocation. There is no evidence of arthropathy or other focal bone abnormality. Soft tissues are unremarkable. IMPRESSION: Negative. Electronically Signed   By: Dorethia Molt M.D.   On: 01/06/2024 22:16     Procedures   Medications Ordered in the ED - No data to display  Medical Decision Making Amount and/or Complexity of Data Reviewed Radiology: ordered.   BP 135/75   Pulse 91   Temp 97.9 F (36.6 C)   Resp 16   LMP  (LMP Unknown)   SpO2 100%   11:43 PM 23 year old female presenting for evaluation of ankle injury.  Patient states she was walking tonight and stepped in a hole and twisted her left ankle.  She report acute onset of sharp pain about the ankle worse with ambulation.  She is able to bear weight.  She has noted some swelling to the affected area.  She denies any knee pain or hip pain.  No specific treatment tried at home.  Exam notable for tenderness about the left ankle and foot at the level of the anterior talofibular ligament.  No obvious deformity no laceration noted.  Patient is  neurovascular intact.  X-ray of the foot obtained showing no acute fracture or dislocation.  Will place ankle in an ASO brace.  RICE therapy discussed.  Ibuprofen offered but patient declined stating that she has medication at home.  Patient stable for discharge.     Final diagnoses:  Sprain of anterior talofibular ligament of left ankle, initial encounter    ED Discharge Orders          Ordered    Crutches        01/06/24 2259               Nivia Colon, PA-C 01/06/24 [id number]    Armenta Canning, MD 01/13/24 [phone]

## 2024-01-06 NOTE — ED Notes (Signed)
 PT refused discharge papers

## 2024-01-06 NOTE — ED Triage Notes (Signed)
 Patient reports left foot pain after falling. She notes increased swelling and bruising. This is also noted.
